# Patient Record
Sex: Female | Born: 1950 | Race: Black or African American | Hispanic: No | Marital: Married | Smoking: Never smoker
Health system: Southern US, Community
[De-identification: ages and names within clinical notes are randomized; demographics above are authoritative.]

## PROBLEM LIST (undated history)

## (undated) DIAGNOSIS — E039 Hypothyroidism, unspecified: Secondary | ICD-10-CM

## (undated) HISTORY — DX: Hypothyroidism, unspecified: E03.9

---

## 2001-03-25 ENCOUNTER — Encounter: Payer: Self-pay | Admitting: Cardiovascular Disease

## 2001-03-25 ENCOUNTER — Ambulatory Visit (HOSPITAL_COMMUNITY): Admission: RE | Admit: 2001-03-25 | Discharge: 2001-03-25 | Payer: Self-pay | Admitting: Cardiovascular Disease

## 2001-05-29 ENCOUNTER — Other Ambulatory Visit: Admission: RE | Admit: 2001-05-29 | Discharge: 2001-05-29 | Payer: Self-pay | Admitting: Internal Medicine

## 2001-11-10 ENCOUNTER — Other Ambulatory Visit: Admission: RE | Admit: 2001-11-10 | Discharge: 2001-11-10 | Payer: Self-pay | Admitting: Internal Medicine

## 2002-04-19 ENCOUNTER — Other Ambulatory Visit: Admission: RE | Admit: 2002-04-19 | Discharge: 2002-04-19 | Payer: Self-pay | Admitting: Obstetrics and Gynecology

## 2002-07-13 ENCOUNTER — Other Ambulatory Visit: Admission: RE | Admit: 2002-07-13 | Discharge: 2002-07-13 | Payer: Self-pay | Admitting: Obstetrics and Gynecology

## 2002-11-02 ENCOUNTER — Other Ambulatory Visit: Admission: RE | Admit: 2002-11-02 | Discharge: 2002-11-02 | Payer: Self-pay | Admitting: Obstetrics and Gynecology

## 2003-04-19 ENCOUNTER — Other Ambulatory Visit: Admission: RE | Admit: 2003-04-19 | Discharge: 2003-04-19 | Payer: Self-pay | Admitting: Obstetrics and Gynecology

## 2004-01-12 ENCOUNTER — Other Ambulatory Visit: Admission: RE | Admit: 2004-01-12 | Discharge: 2004-01-12 | Payer: Self-pay | Admitting: Obstetrics and Gynecology

## 2004-07-24 ENCOUNTER — Inpatient Hospital Stay (HOSPITAL_COMMUNITY): Admission: AD | Admit: 2004-07-24 | Discharge: 2004-07-25 | Payer: Self-pay | Admitting: Obstetrics and Gynecology

## 2005-04-08 ENCOUNTER — Other Ambulatory Visit: Admission: RE | Admit: 2005-04-08 | Discharge: 2005-04-08 | Payer: Self-pay | Admitting: Obstetrics and Gynecology

## 2006-01-22 IMAGING — US US TRANSVAGINAL NON-OB
1 series · 18 of 25 positions shown · non-contrast
Comparison: none

CLINICAL DATA: Anemia, menorrhagia, and dysfunctional uterine bleeding. 
TRANSVAGINAL AND TRANSABDOMINAL PELVIC ULTRASOUND ? 07/24/2004
No prior studies for comparison. 
Both transabdominal and transvaginal studies were performed.  
TRANSABDOMINAL PELVIC ULTRASOUND  
Uterine dimensions are approximately 11 x 5.4 x 6.1 cm.  The endometrium appears prominent.   Adnexal regions are not visualized transabdominally.   
TRANSVAGINAL PELVIC SONOGRAPHY 
The endometrium is abnormally thickened for the patient?s age, measuring 24 mm.  Although fairly homogeneous, underlying endometrial carcinoma or polyp is not excluded by standard sonography. Uterine dimensions are approximately 10.3 x 5.7 x 7.2 cm.  There are no visible fibroids or other masses within the myometrium.  
The right ovary measures 2.6 x 1.7 x 1.8 cm and has a normal appearance.  The left ovary measures 4 x 2.6 x 3.2 cm.  There is a simple cyst of the left ovary measuring 2.5 x 2.1 x 3.2 cm.  Very small amount of free fluid is present in the pelvis. 
IMPRESSION
Thickened endometrium measuring 24 mm.  Underlying endometrial abnormality such as carcinoma or polyp is not excluded.  2.5 cm simple cyst of left ovary.

[Series 1: us transvaginal non-ob · 18 of 57 slices shown]
[im 1/57]
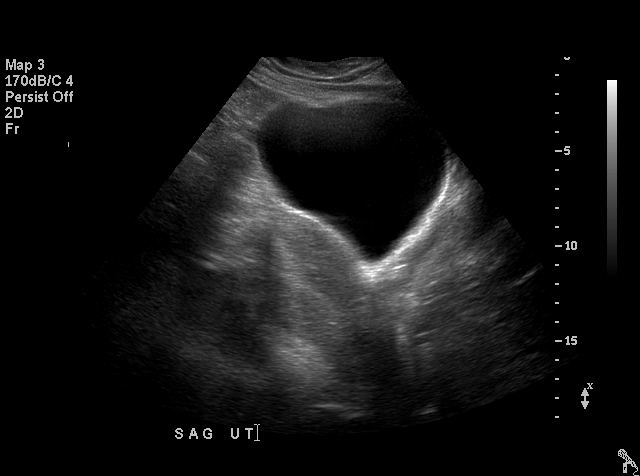
[im 5/57]
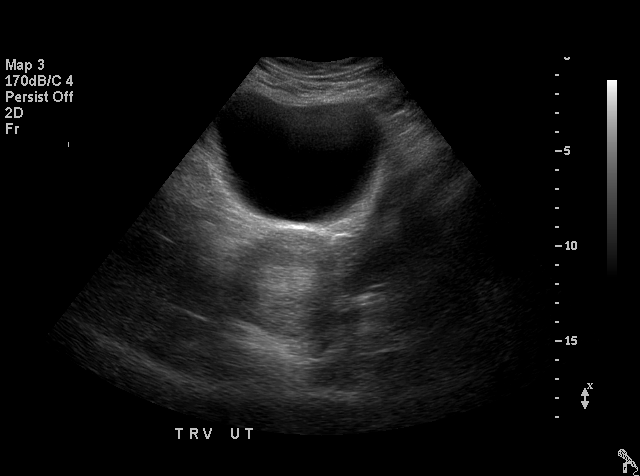
[im 8/57]
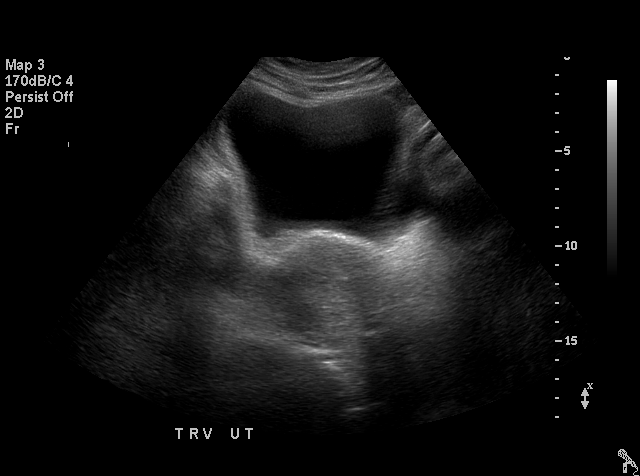
[im 10/57]
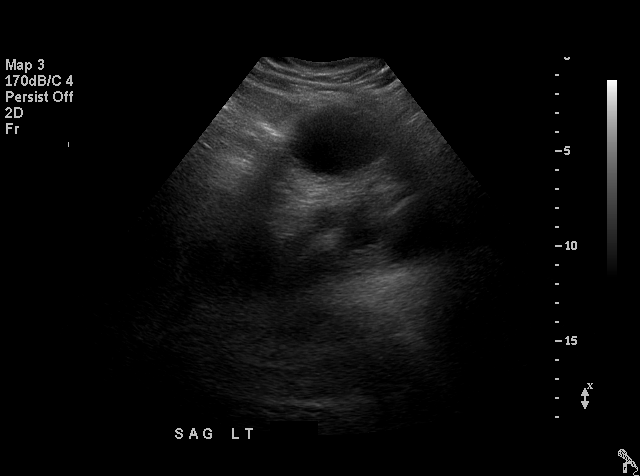
[im 15/57]
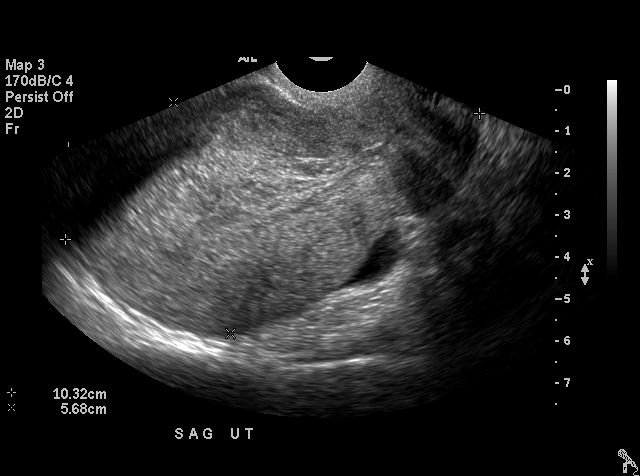
[im 17/57]
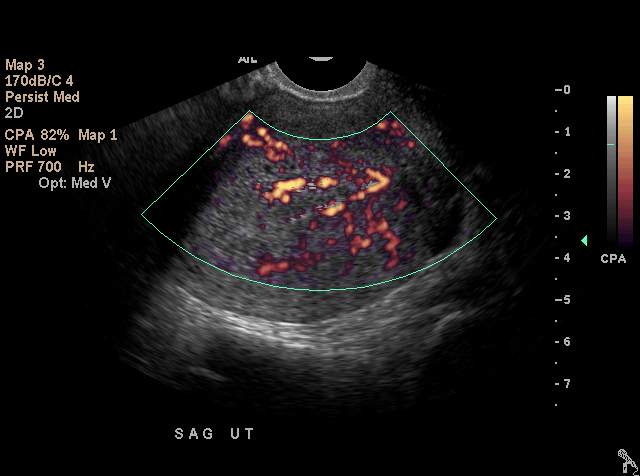
[im 22/57]
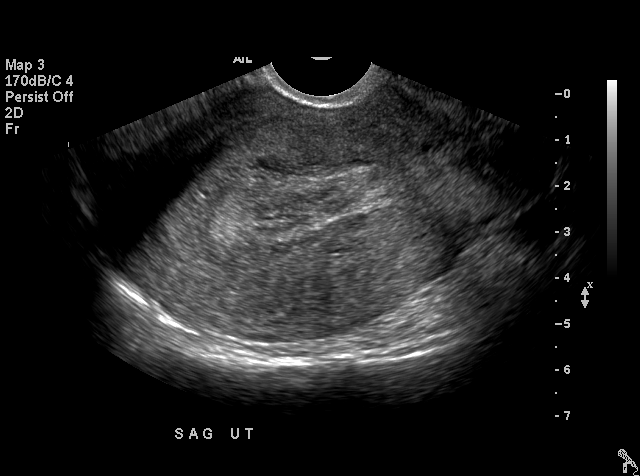
[im 24/57]
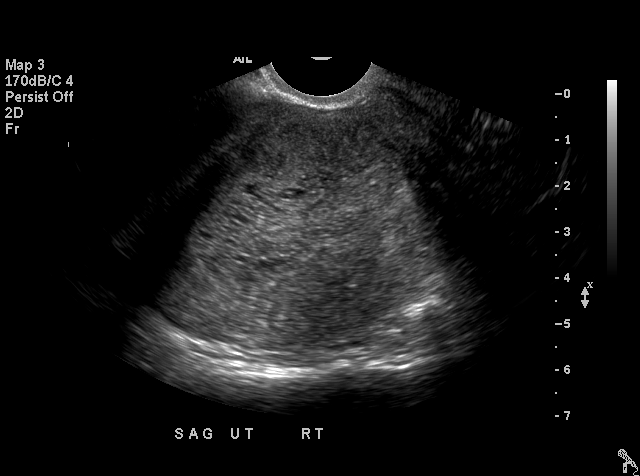
[im 26/57]
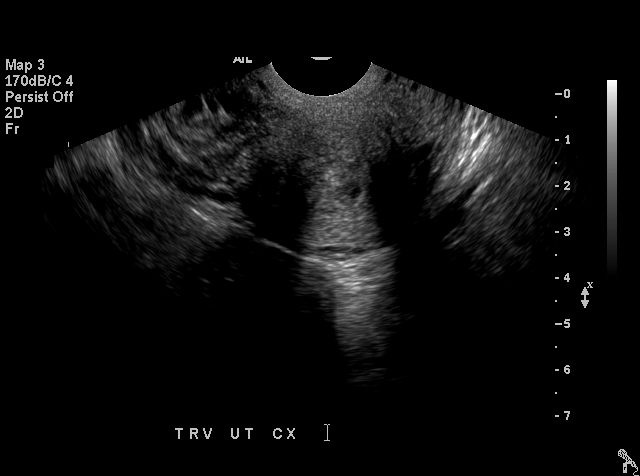
[im 31/57]
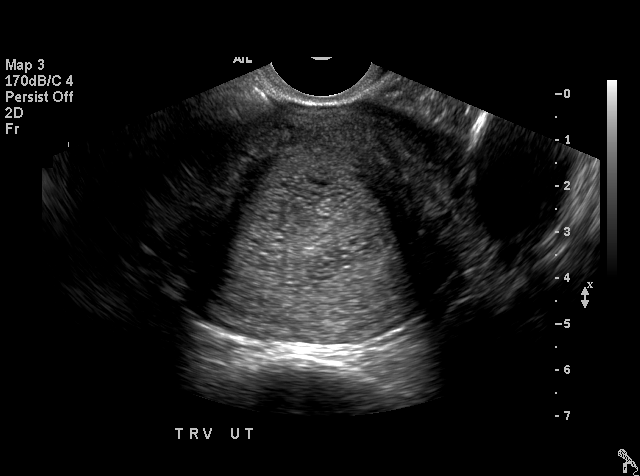
[im 33/57]
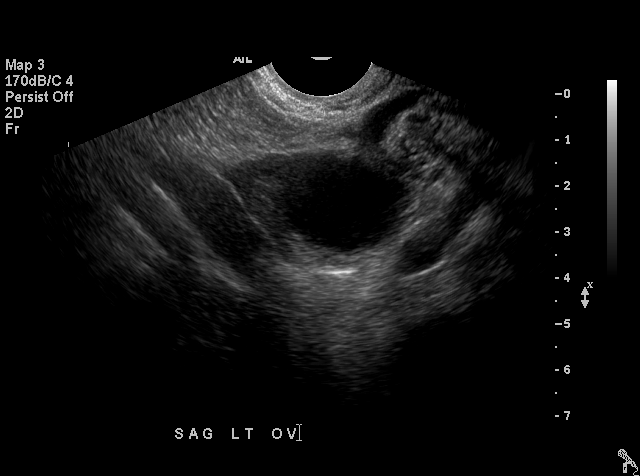
[im 36/57]
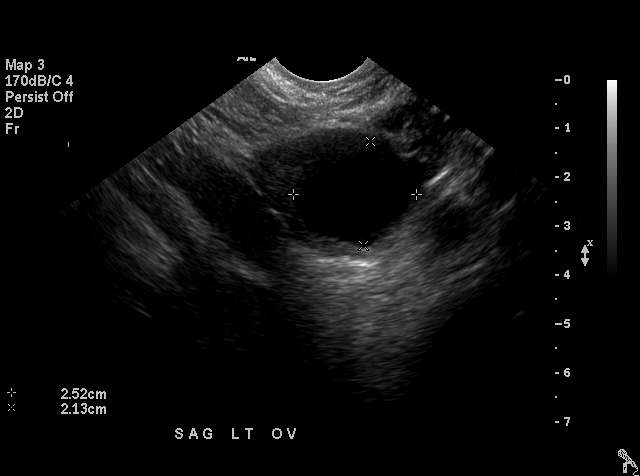
[im 40/57]
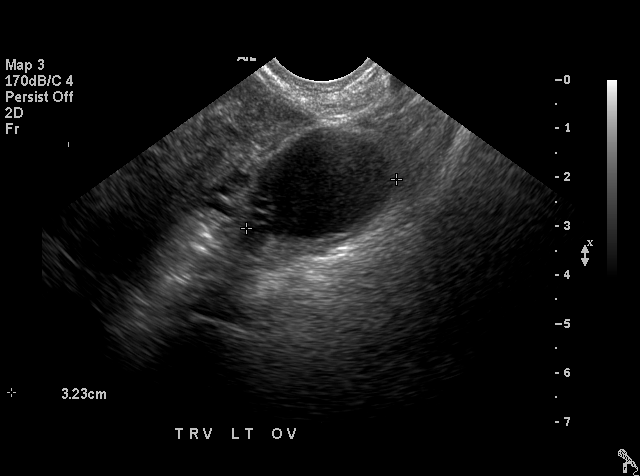
[im 43/57]
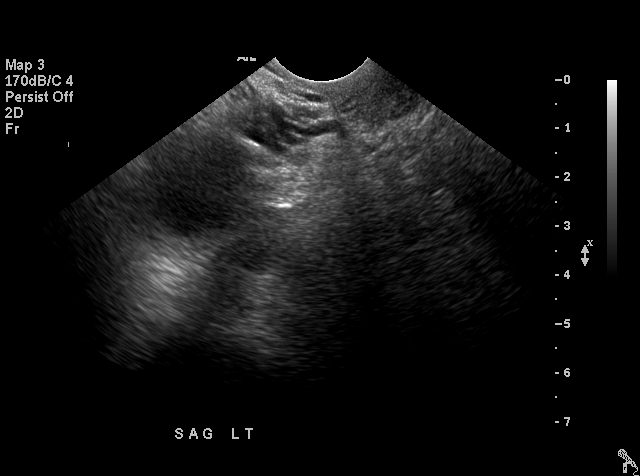
[im 47/57]
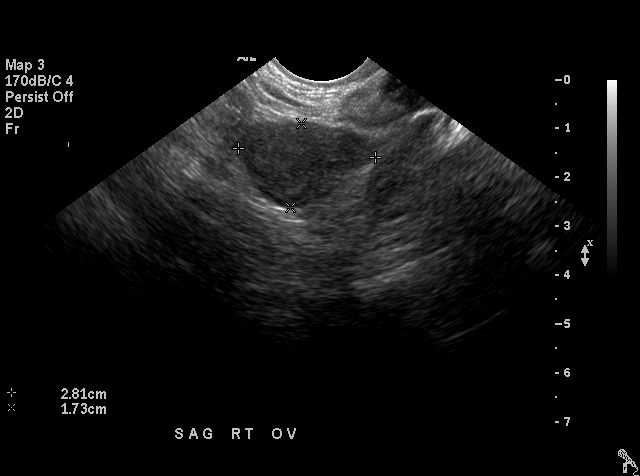
[im 50/57]
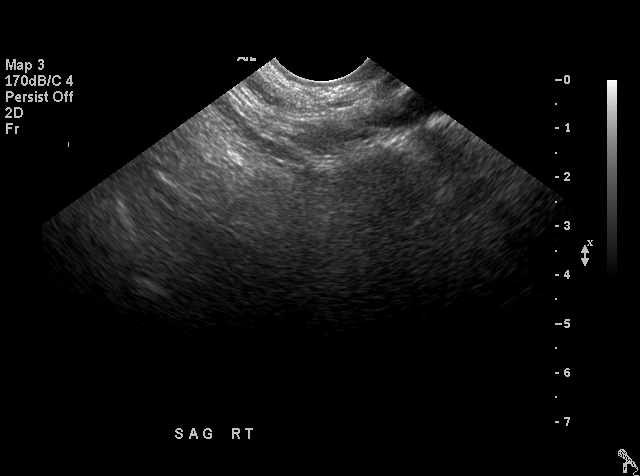
[im 52/57]
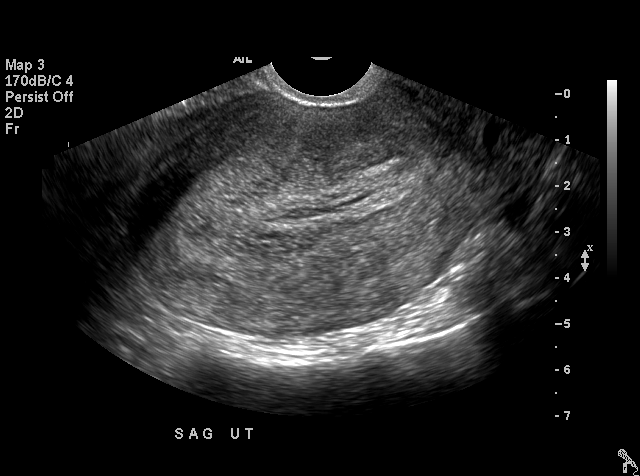
[im 57/57]
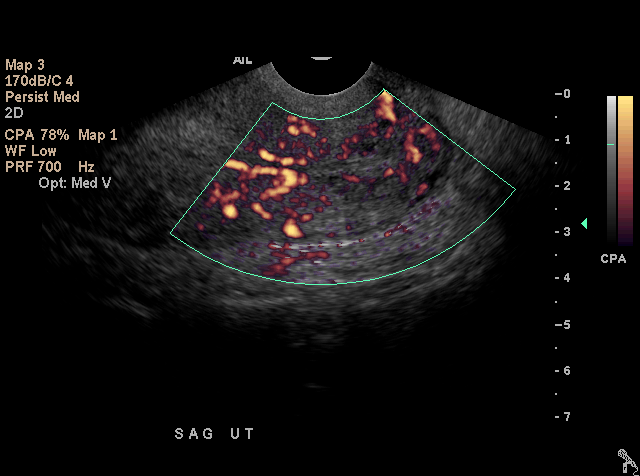

[18 of 25 positions shown; findings below may reference images not displayed]

## 2006-04-09 ENCOUNTER — Other Ambulatory Visit: Admission: RE | Admit: 2006-04-09 | Discharge: 2006-04-09 | Payer: Self-pay | Admitting: Obstetrics and Gynecology

## 2017-05-13 ENCOUNTER — Other Ambulatory Visit: Payer: Self-pay | Admitting: Nurse Practitioner

## 2017-05-13 DIAGNOSIS — R221 Localized swelling, mass and lump, neck: Secondary | ICD-10-CM

## 2017-05-21 ENCOUNTER — Ambulatory Visit
Admission: RE | Admit: 2017-05-21 | Discharge: 2017-05-21 | Disposition: A | Discharge status: Home / Self Care | Payer: BC Managed Care – PPO | Source: Ambulatory Visit | Attending: Nurse Practitioner | Admitting: Nurse Practitioner

## 2017-05-21 DIAGNOSIS — R221 Localized swelling, mass and lump, neck: Secondary | ICD-10-CM

## 2018-09-29 ENCOUNTER — Encounter: Payer: Self-pay | Admitting: Internal Medicine

## 2018-09-29 ENCOUNTER — Ambulatory Visit: Payer: BC Managed Care – PPO | Admitting: Internal Medicine

## 2018-09-29 VITALS — BP 130/80 | HR 65 | Temp 97.4°F | Ht 68.5 in | Wt 216.0 lb

## 2018-09-29 DIAGNOSIS — T2010XA Burn of first degree of head, face, and neck, unspecified site, initial encounter: Secondary | ICD-10-CM | POA: Diagnosis not present

## 2018-09-29 DIAGNOSIS — T3110 Burns involving 10-19% of body surface with 0% to 9% third degree burns: Secondary | ICD-10-CM

## 2018-09-29 DIAGNOSIS — T22212A Burn of second degree of left forearm, initial encounter: Secondary | ICD-10-CM | POA: Diagnosis not present

## 2018-09-29 DIAGNOSIS — Y93G3 Activity, cooking and baking: Secondary | ICD-10-CM

## 2018-09-29 DIAGNOSIS — X102XXA Contact with fats and cooking oils, initial encounter: Secondary | ICD-10-CM | POA: Diagnosis not present

## 2018-09-29 DIAGNOSIS — E038 Other specified hypothyroidism: Secondary | ICD-10-CM

## 2018-09-29 DIAGNOSIS — E039 Hypothyroidism, unspecified: Secondary | ICD-10-CM | POA: Insufficient documentation

## 2018-09-29 DIAGNOSIS — E782 Mixed hyperlipidemia: Secondary | ICD-10-CM | POA: Insufficient documentation

## 2018-09-29 DIAGNOSIS — E785 Hyperlipidemia, unspecified: Secondary | ICD-10-CM

## 2018-09-29 DIAGNOSIS — T300 Burn of unspecified body region, unspecified degree: Secondary | ICD-10-CM

## 2018-09-29 DIAGNOSIS — Z23 Encounter for immunization: Secondary | ICD-10-CM

## 2018-09-29 MED ORDER — SILVER SULFADIAZINE 1 % EX CREA: 1.0000 "application " | TOPICAL_CREAM | Freq: Two times a day (BID) | CUTANEOUS | 0 refills | Status: AC
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
## 2018-09-29 NOTE — Patient Instructions (Signed)
Burn Care, Adult A burn is an injury to the skin or the tissues under the skin. There are three types of burns:  First degree. These burns may cause the skin to be red and a bit swollen.  Second degree. These burns are very painful and cause the skin to be very red. The skin may also leak fluid, look shiny, and start to have blisters.  Third degree. These burns cause permanent damage. They turn the skin white or black and make it look charred, dry, and leathery.  Taking care of your burn properly can help to prevent pain and infection. It can also help the burn to heal more quickly. How is this treated? Right after a burn:  Rinse or soak the burn under cool water. Do this for several minutes. Do not put ice on your burn. That can cause more damage.  Lightly cover the burn with a clean (sterile) cloth (dressing). Burn care  Raise (elevate) the injured area above the level of your heart while sitting or lying down.  Follow instructions from your doctor about: ? How to clean and take care of the burn. ? When to change and remove the cloth.  Check your burn every day for signs of infection. Check for: ? More redness, swelling, or pain. ? Warmth. ? Pus or a bad smell. Medicine   Take over-the-counter and prescription medicines only as told by your doctor.  If you were prescribed antibiotic medicine, take or apply it as told by your doctor. Do not stop using the antibiotic even if your condition improves. General instructions  To prevent infection: ? Do not put butter, oil, or other home treatments on the burn. ? Do not scratch or pick at the burn. ? Do not break any blisters. ? Do not peel skin.  Do not rub your burn, even when you are cleaning it.  Protect your burn from the sun. Contact a doctor if:  Your condition does not get better.  Your condition gets worse.  You have a fever.  Your burn looks different or starts to have black or red spots on it.  Your burn  feels warm to the touch.  Your pain is not controlled with medicine. Get help right away if:  You have redness, swelling, or pain at the site of the burn.  You have fluid, blood, or pus coming from your burn.  You have red streaks near the burn.  You have very bad pain. This information is not intended to replace advice given to you by your health care provider. Make sure you discuss any questions you have with your health care provider. Document Released: 07/23/2008 Document Revised: 11/30/2016 Document Reviewed: 04/02/2016 Elsevier Interactive Patient Education  2018 [employer] Inc[employer]  

## 2018-10-06 ENCOUNTER — Ambulatory Visit: Payer: BC Managed Care – PPO | Admitting: Internal Medicine

## 2018-10-06 ENCOUNTER — Encounter: Payer: Self-pay | Admitting: Internal Medicine

## 2018-10-06 VITALS — BP 116/76 | HR 57 | Temp 97.9°F | Ht 68.5 in | Wt 217.0 lb

## 2018-10-06 DIAGNOSIS — T2010XS Burn of first degree of head, face, and neck, unspecified site, sequela: Secondary | ICD-10-CM

## 2018-10-06 DIAGNOSIS — T22212S Burn of second degree of left forearm, sequela: Secondary | ICD-10-CM

## 2018-10-06 DIAGNOSIS — Z09 Encounter for follow-up examination after completed treatment for conditions other than malignant neoplasm: Secondary | ICD-10-CM | POA: Diagnosis not present

## 2018-10-06 DIAGNOSIS — X102XXS Contact with fats and cooking oils, sequela: Secondary | ICD-10-CM

## 2018-10-06 DIAGNOSIS — T300 Burn of unspecified body region, unspecified degree: Secondary | ICD-10-CM
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
## 2018-10-19 ENCOUNTER — Ambulatory Visit: Payer: BC Managed Care – PPO | Admitting: Nurse Practitioner

## 2018-10-19 ENCOUNTER — Encounter: Payer: Self-pay | Admitting: Nurse Practitioner

## 2018-10-19 VITALS — BP 130/86 | HR 58 | Temp 97.7°F | Ht 68.0 in | Wt 216.6 lb

## 2018-10-19 DIAGNOSIS — T2642XD Burn of left eye and adnexa, part unspecified, subsequent encounter: Secondary | ICD-10-CM

## 2018-10-19 DIAGNOSIS — T22212S Burn of second degree of left forearm, sequela: Secondary | ICD-10-CM | POA: Diagnosis not present

## 2018-10-19 DIAGNOSIS — T3110 Burns involving 10-19% of body surface with 0% to 9% third degree burns: Secondary | ICD-10-CM | POA: Insufficient documentation

## 2018-10-19 DIAGNOSIS — T2000XS Burn of unspecified degree of head, face, and neck, unspecified site, sequela: Secondary | ICD-10-CM

## 2018-10-19 DIAGNOSIS — T2000XD Burn of unspecified degree of head, face, and neck, unspecified site, subsequent encounter: Secondary | ICD-10-CM | POA: Insufficient documentation

## 2018-10-19 DIAGNOSIS — T2000XA Burn of unspecified degree of head, face, and neck, unspecified site, initial encounter: Secondary | ICD-10-CM

## 2018-10-19 NOTE — Patient Instructions (Signed)
Burn Care, Adult  A burn is an injury to the skin or the tissues under the skin. There are three types of burns:  · First degree. These burns may cause the skin to be red and a bit swollen.  · Second degree. These burns are very painful and cause the skin to be very red. The skin may also leak fluid, look shiny, and start to have blisters.  · Third degree. These burns cause permanent damage. They turn the skin white or black and make it look charred, dry, and leathery.  Taking care of your burn properly can help to prevent pain and infection. It can also help the burn to heal more quickly.  How is this treated?  Right after a burn:  · Rinse or soak the burn under cool water. Do this for several minutes. Do not put ice on your burn. That can cause more damage.  · Lightly cover the burn with a clean (sterile) cloth (dressing).  Burn care  · Raise (elevate) the injured area above the level of your heart while sitting or lying down.  · Follow instructions from your doctor about:  ? How to clean and take care of the burn.  ? When to change and remove the cloth.  · Check your burn every day for signs of infection. Check for:  ? More redness, swelling, or pain.  ? Warmth.  ? Pus or a bad smell.  Medicine    · Take over-the-counter and prescription medicines only as told by your doctor.  · If you were prescribed antibiotic medicine, take or apply it as told by your doctor. Do not stop using the antibiotic even if your condition improves.  General instructions  · To prevent infection:  ? Do not put butter, oil, or other home treatments on the burn.  ? Do not scratch or pick at the burn.  ? Do not break any blisters.  ? Do not peel skin.  · Do not rub your burn, even when you are cleaning it.  · Protect your burn from the sun.  Contact a doctor if:  · Your condition does not get better.  · Your condition gets worse.  · You have a fever.  · Your burn looks different or starts to have black or red spots on it.  · Your burn  feels warm to the touch.  · Your pain is not controlled with medicine.  Get help right away if:  · You have redness, swelling, or pain at the site of the burn.  · You have fluid, blood, or pus coming from your burn.  · You have red streaks near the burn.  · You have very bad pain.  This information is not intended to replace advice given to you by your health care provider. Make sure you discuss any questions you have with your health care provider.  Document Released: 07/23/2008 Document Revised: 05/27/2017 Document Reviewed: 04/02/2016  Elsevier Interactive Patient Education © 2019 Elsevier Inc.
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
## 2018-10-27 ENCOUNTER — Encounter: Payer: Self-pay | Admitting: Nurse Practitioner

## 2018-10-29 ENCOUNTER — Ambulatory Visit: Payer: BC Managed Care – PPO | Admitting: Nurse Practitioner

## 2018-10-29 ENCOUNTER — Encounter: Payer: Self-pay | Admitting: Nurse Practitioner

## 2018-10-29 VITALS — BP 130/82 | HR 63 | Temp 97.6°F | Ht 68.0 in | Wt 214.0 lb

## 2018-10-29 DIAGNOSIS — T3110 Burns involving 10-19% of body surface with 0% to 9% third degree burns: Secondary | ICD-10-CM | POA: Diagnosis not present

## 2018-10-29 DIAGNOSIS — T2642XD Burn of left eye and adnexa, part unspecified, subsequent encounter: Secondary | ICD-10-CM

## 2018-10-29 DIAGNOSIS — T2000XD Burn of unspecified degree of head, face, and neck, unspecified site, subsequent encounter: Secondary | ICD-10-CM

## 2018-10-29 DIAGNOSIS — T22212S Burn of second degree of left forearm, sequela: Secondary | ICD-10-CM
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
## 2018-11-25 ENCOUNTER — Other Ambulatory Visit: Payer: Self-pay | Admitting: Nurse Practitioner

## 2018-11-30 ENCOUNTER — Ambulatory Visit: Payer: BC Managed Care – PPO | Admitting: Nurse Practitioner

## 2018-11-30 ENCOUNTER — Encounter: Payer: Self-pay | Admitting: Nurse Practitioner

## 2018-11-30 VITALS — BP 122/74 | HR 60 | Temp 98.1°F | Ht 65.6 in | Wt 214.4 lb

## 2018-11-30 DIAGNOSIS — E039 Hypothyroidism, unspecified: Secondary | ICD-10-CM

## 2018-11-30 DIAGNOSIS — T22212S Burn of second degree of left forearm, sequela: Secondary | ICD-10-CM

## 2018-11-30 MED ORDER — LEVOTHYROXINE SODIUM 88 MCG PO TABS: 88.0000 ug | ORAL_TABLET | Freq: Every day | ORAL | 1 refills | Status: DC
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
## 2018-11-30 NOTE — Patient Instructions (Signed)
Hypothyroidism  Hypothyroidism is when the thyroid gland does not make enough of certain hormones (it is underactive). The thyroid gland is a small gland located in the lower front part of the neck, just in front of the windpipe (trachea). This gland makes hormones that help control how the body uses food for energy (metabolism) as well as how the heart and brain function. These hormones also play a role in keeping your bones strong. When the thyroid is underactive, it produces too little of the hormones thyroxine (T4) and triiodothyronine (T3). What are the causes? This condition may be caused by:  Hashimoto's disease. This is a disease in which the body's disease-fighting system (immune system) attacks the thyroid gland. This is the most common cause.  Viral infections.  Pregnancy.  Certain medicines.  Birth defects.  Past radiation treatments to the head or neck for cancer.  Past treatment with radioactive iodine.  Past exposure to radiation in the environment.  Past surgical removal of part or all of the thyroid.  Problems with a gland in the center of the brain (pituitary gland).  Lack of enough iodine in the diet. What increases the risk? You are more likely to develop this condition if:  You are female.  You have a family history of thyroid conditions.  You use a medicine called lithium.  You take medicines that affect the immune system (immunosuppressants). What are the signs or symptoms? Symptoms of this condition include:  Feeling as though you have no energy (lethargy).  Not being able to tolerate cold.  Weight gain that is not explained by a change in diet or exercise habits.  Lack of appetite.  Dry skin.  Coarse hair.  Menstrual irregularity.  Slowing of thought processes.  Constipation.  Sadness or depression. How is this diagnosed? This condition may be diagnosed based on:  Your symptoms, your medical history, and a physical exam.  Blood  tests. You may also have imaging tests, such as an ultrasound or MRI. How is this treated? This condition is treated with medicine that replaces the thyroid hormones that your body does not make. After you begin treatment, it may take several weeks for symptoms to go away. Follow these instructions at home:  Take over-the-counter and prescription medicines only as told by your health care provider.  If you start taking any new medicines, tell your health care provider.  Keep all follow-up visits as told by your health care provider. This is important. ? As your condition improves, your dosage of thyroid hormone medicine may change. ? You will need to have blood tests regularly so that your health care provider can monitor your condition. Contact a health care provider if:  Your symptoms do not get better with treatment.  You are taking thyroid replacement medicine and you: ? Sweat a lot. ? Have tremors. ? Feel anxious. ? Lose weight rapidly. ? Cannot tolerate heat. ? Have emotional swings. ? Have diarrhea. ? Feel weak. Get help right away if you have:  Chest pain.  An irregular heartbeat.  A rapid heartbeat.  Difficulty breathing. Summary  Hypothyroidism is when the thyroid gland does not make enough of certain hormones (it is underactive).  When the thyroid is underactive, it produces too little of the hormones thyroxine (T4) and triiodothyronine (T3).  The most common cause is Hashimoto's disease, a disease in which the body's disease-fighting system (immune system) attacks the thyroid gland. The condition can also be caused by viral infections, medicine, pregnancy, or past   radiation treatment to the head or neck.  Symptoms may include weight gain, dry skin, constipation, feeling as though you do not have energy, and not being able to tolerate cold.  This condition is treated with medicine to replace the thyroid hormones that your body does not make. This information  is not intended to replace advice given to you by your health care provider. Make sure you discuss any questions you have with your health care provider. Document Released: 10/14/2005 Document Revised: 09/24/2017 Document Reviewed: 09/24/2017 Elsevier Interactive Patient Education  2019 [employer]  

## 2018-12-01 LAB — T4: T4 TOTAL: 9.1 ug/dL (ref 4.5–12.0)

## 2018-12-01 LAB — T3, FREE: T3, Free: 2.4 pg/mL (ref 2.0–4.4)

## 2018-12-01 LAB — TSH: TSH: 2.03 u[IU]/mL (ref 0.450–4.500)

## 2019-01-04 ENCOUNTER — Ambulatory Visit: Payer: BC Managed Care – PPO | Admitting: Nurse Practitioner

## 2019-01-04 ENCOUNTER — Ambulatory Visit: Payer: Self-pay | Admitting: Nurse Practitioner

## 2019-01-04 VITALS — BP 112/80 | HR 52 | Temp 97.9°F | Wt 212.2 lb

## 2019-01-04 DIAGNOSIS — X102XXS Contact with fats and cooking oils, sequela: Secondary | ICD-10-CM | POA: Diagnosis not present

## 2019-01-04 DIAGNOSIS — T22212S Burn of second degree of left forearm, sequela: Secondary | ICD-10-CM

## 2019-01-04 NOTE — Progress Notes (Signed)
  Subjective:     Patient ID: Julie Zamora , female    DOB: June 17, 1951 , 68 y.o.   MRN: [id number]   Chief Complaint  Patient presents with  . Burn    HPI  Left arm blistering she has been using neosporin.    She has been wearing jewelry, applying her hands, wearing sweaters.      Past Medical History:  Diagnosis Date  . Hypothyroidism      Family History  Problem Relation Age of Onset  . Cancer Mother   . Diabetes Father   . Cancer Cousin   . Cancer Cousin      Current Outpatient Medications:  .  levothyroxine (SYNTHROID, LEVOTHROID) 88 MCG tablet, Take 1 tablet (88 mcg total) by mouth daily., Disp: 90 tablet, Rfl: 1 .  nystatin (NYSTATIN) powder, Nystop 100,000 unit/gram topical powder, Disp: , Rfl:    No Known Allergies   Review of Systems  Respiratory: Negative.   Cardiovascular: Negative.   Skin: Positive for wound (left forearm).  Neurological: Negative for dizziness and headaches.     Today's Vitals   01/04/19 1620  BP: 112/80  Pulse: (!) 52  Temp: 97.9 F (36.6 C)  TempSrc: Oral  SpO2: 96%  Weight: 212 lb 3.2 oz (96.3 kg)   Body mass index is 34.67 kg/m.   Objective:  Physical Exam Constitutional:      Appearance: Normal appearance.  Cardiovascular:     Rate and Rhythm: Normal rate.     Pulses: Normal pulses.     Heart sounds: Normal heart sounds. No murmur.  Pulmonary:     Effort: Pulmonary effort is normal. No respiratory distress.     Breath sounds: Normal breath sounds.  Skin:    General: Skin is warm and dry.     Capillary Refill: Capillary refill takes less than 2 seconds.     Comments: Left wrist/forearm with dried area and slightly irritated area.  Neurological:     General: No focal deficit present.     Mental Status: She is alert.  Psychiatric:        Mood and Affect: Mood normal.        Behavior: Behavior normal.        Thought Content: Thought content normal.        Judgment: Judgment normal.         Assessment  And Plan:     1. Burn of forearm, left, second degree, sequela  Dried healing burn, advised to avoid wearing jewelry or watches on this arm.   May continue to use neosporin to area.    Arnette Felts, FNP

## 2019-01-04 NOTE — Patient Instructions (Signed)
Keep area clean and dry Clean with antibacterial soap No jewelry to that arm This process will be about 6 months

## 2019-01-06 ENCOUNTER — Other Ambulatory Visit: Payer: Self-pay | Admitting: Nurse Practitioner

## 2019-01-06 DIAGNOSIS — E039 Hypothyroidism, unspecified: Secondary | ICD-10-CM

## 2019-01-24 ENCOUNTER — Encounter: Payer: Self-pay | Admitting: Nurse Practitioner

## 2019-03-31 ENCOUNTER — Other Ambulatory Visit: Payer: Self-pay | Admitting: Nurse Practitioner

## 2019-03-31 DIAGNOSIS — E039 Hypothyroidism, unspecified: Secondary | ICD-10-CM

## 2019-05-18 IMAGING — US US THYROID
1 series · 13 of 13 positions shown · non-contrast
Comparison: None.

CLINICAL DATA: Posterior right neck nontender palpable region x1
month.

EXAM:
ULTRASOUND OF HEAD/NECK SOFT TISSUES
TECHNIQUE: Ultrasound examination of the head and neck soft tissues was
performed in the area of clinical concern.

[Series 1: us thyroid · 0.03mm/px · 13 of 13 slices shown]
[im 1/13]
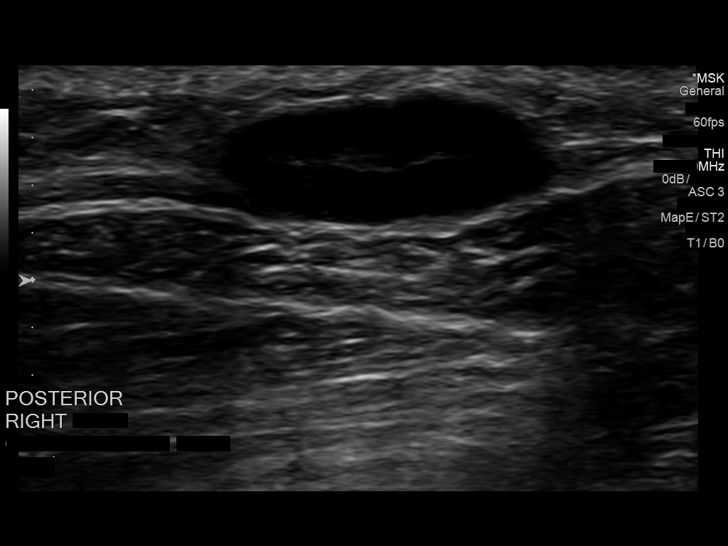
[im 2/13]
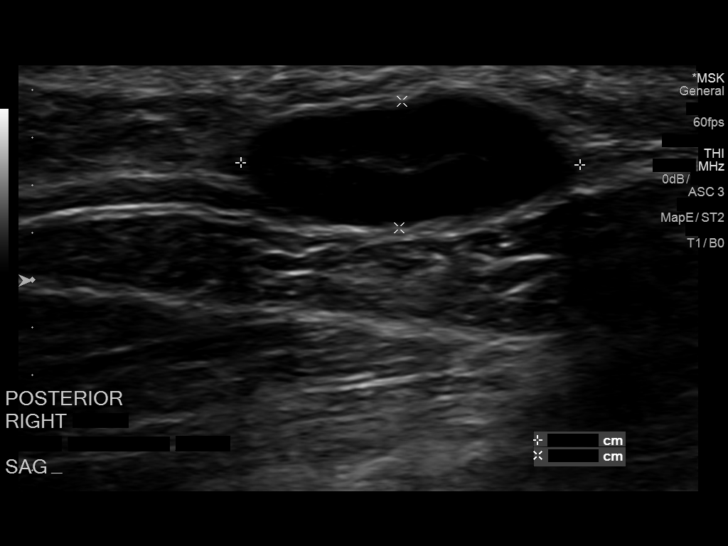
[im 3/13]
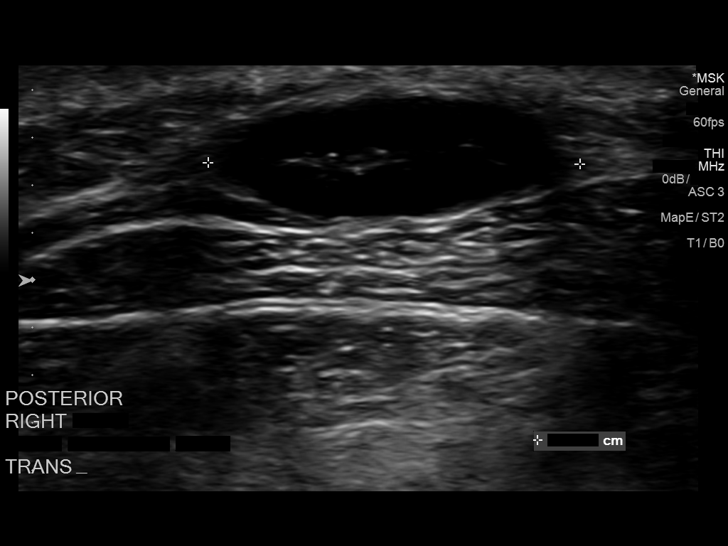
[im 4/13]
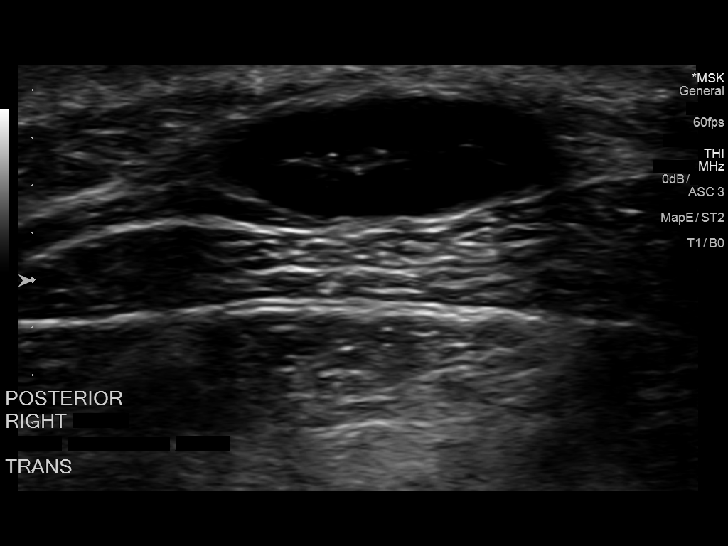
[im 5/13]
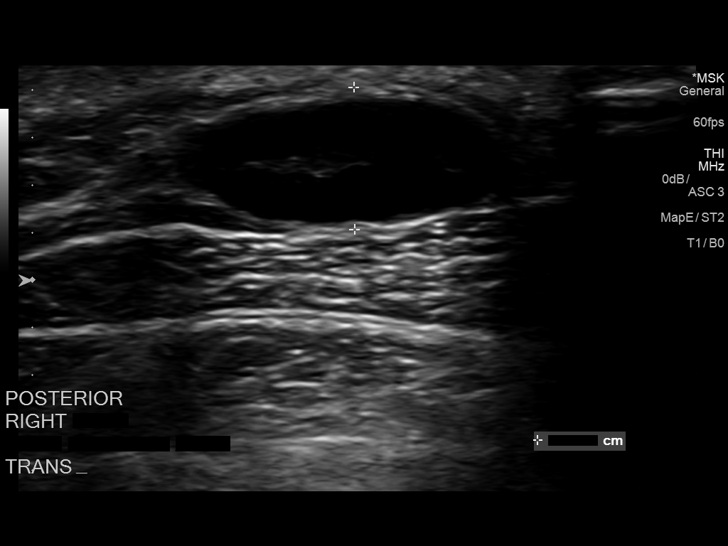
[im 6/13]
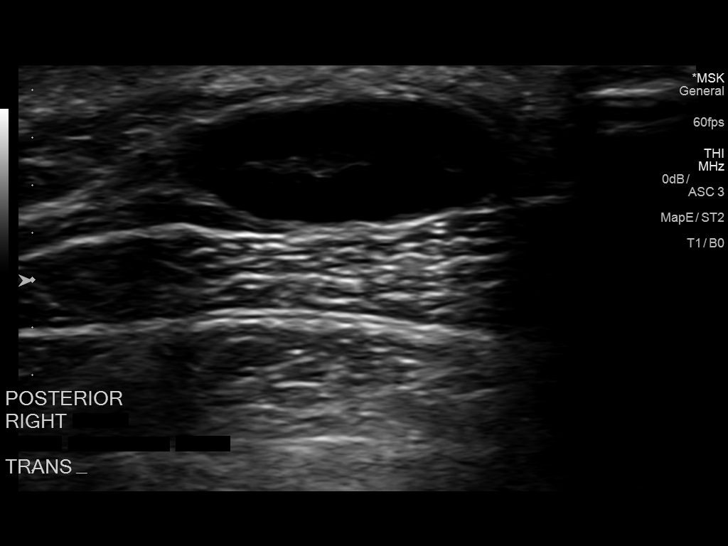
[im 7/13]
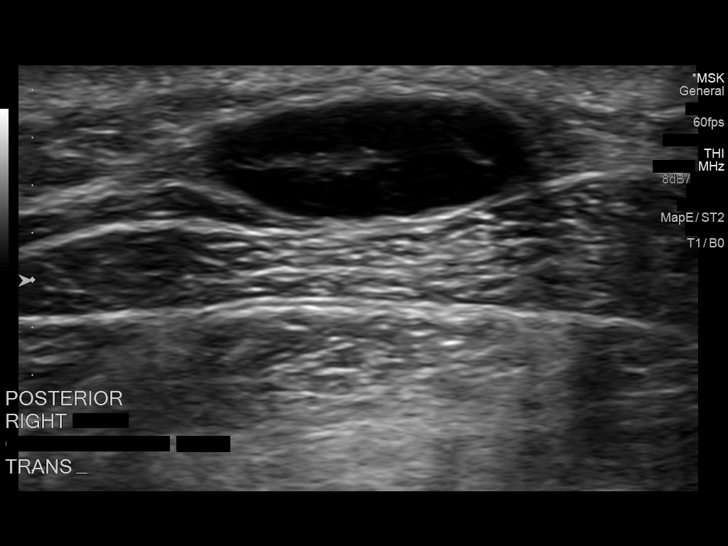
[im 8/13]
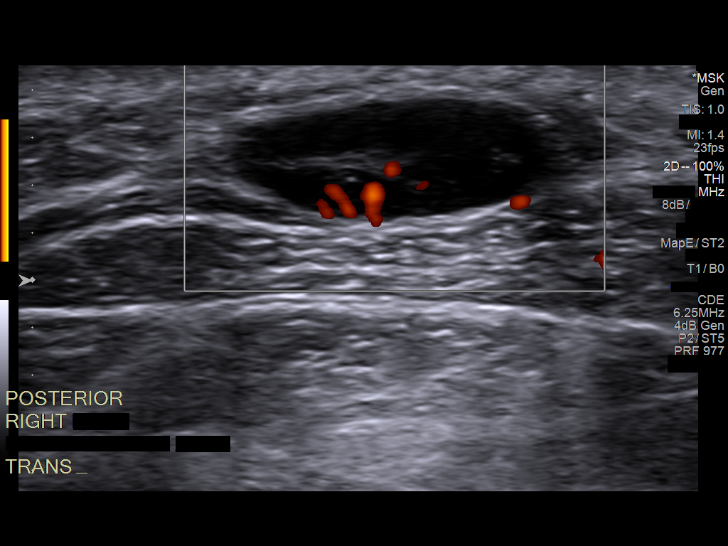
[im 9/13]
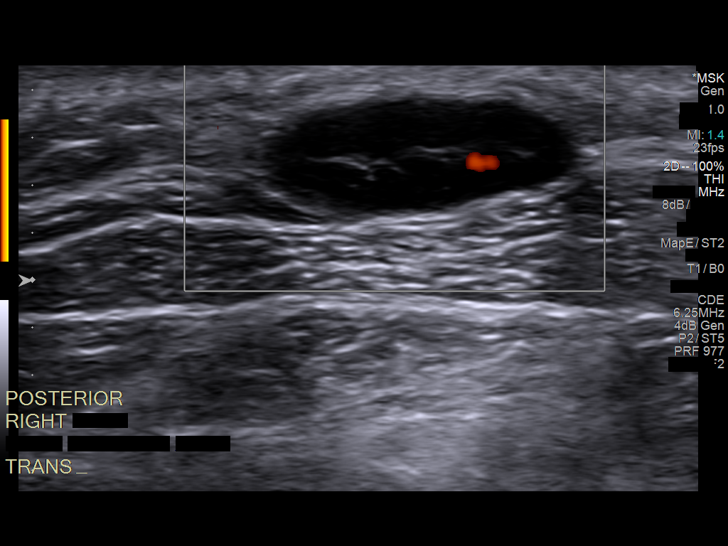
[im 10/13]
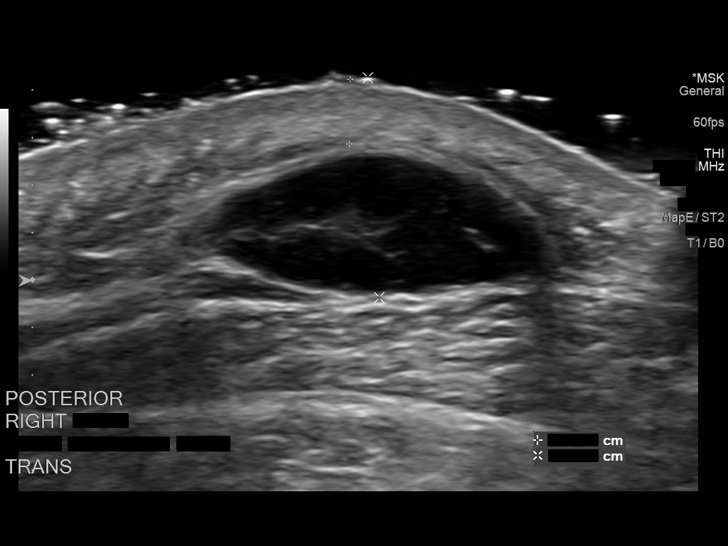
[im 11/13]
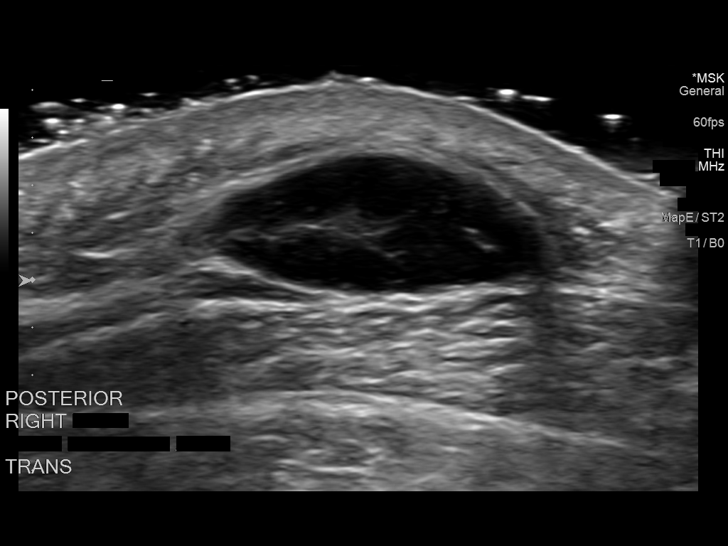
[im 12/13]
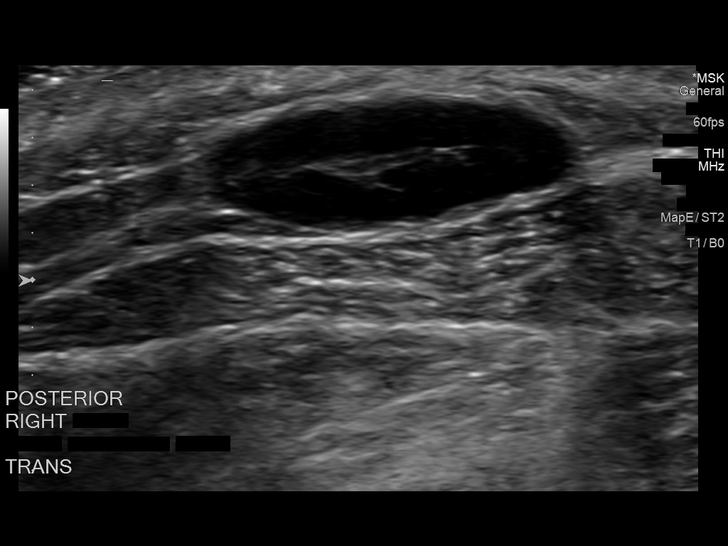
[im 13/13]
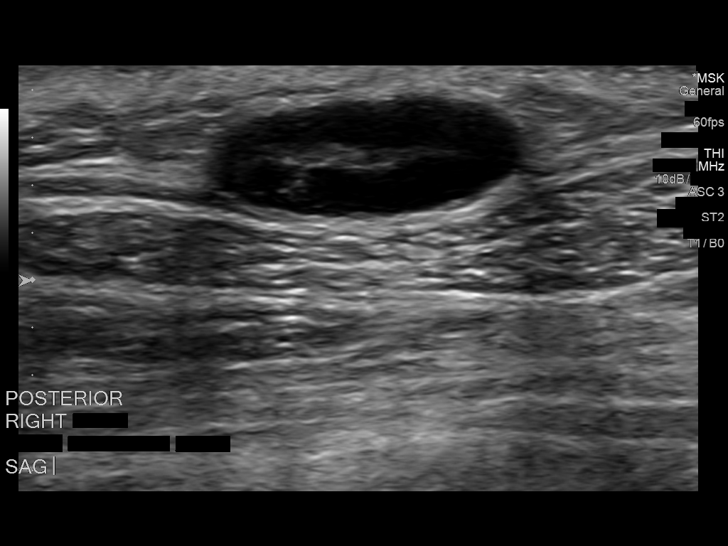

[13 of 13 positions shown; findings below may reference images not displayed]

FINDINGS: There is a hypoechoic lymph node 1.4 x 0.5 x 1.6 cm corresponding to
palpable region. Normal fatty hilum with low level color flow signal
. No other pathologic findings.
IMPRESSION: 1. Prominent subcutaneous lymph node corresponding to palpable
region.

## 2019-06-02 ENCOUNTER — Encounter: Payer: Self-pay | Admitting: Nurse Practitioner

## 2019-06-02 ENCOUNTER — Other Ambulatory Visit: Payer: Self-pay

## 2019-06-02 ENCOUNTER — Ambulatory Visit: Payer: BC Managed Care – PPO | Admitting: Nurse Practitioner

## 2019-06-02 VITALS — BP 122/80 | HR 51 | Temp 98.1°F | Ht 68.4 in | Wt 217.8 lb

## 2019-06-02 DIAGNOSIS — Z0000 Encounter for general adult medical examination without abnormal findings: Secondary | ICD-10-CM

## 2019-06-02 DIAGNOSIS — E039 Hypothyroidism, unspecified: Secondary | ICD-10-CM | POA: Diagnosis not present

## 2019-06-02 DIAGNOSIS — Z23 Encounter for immunization: Secondary | ICD-10-CM

## 2019-06-02 DIAGNOSIS — E782 Mixed hyperlipidemia: Secondary | ICD-10-CM | POA: Diagnosis not present

## 2019-06-02 DIAGNOSIS — Z139 Encounter for screening, unspecified: Secondary | ICD-10-CM

## 2019-06-02 LAB — POCT URINALYSIS DIPSTICK
Bilirubin, UA: NEGATIVE
Glucose, UA: NEGATIVE
Ketones, UA: NEGATIVE
Nitrite, UA: NEGATIVE
Protein, UA: NEGATIVE
Spec Grav, UA: 1.02 (ref 1.010–1.025)
Urobilinogen, UA: 0.2 E.U./dL
pH, UA: 5.5 (ref 5.0–8.0)

## 2019-06-02 NOTE — Patient Instructions (Signed)
Health Maintenance, Female Adopting a healthy lifestyle and getting preventive care are important in promoting health and wellness. Ask your health care provider about:  The right schedule for you to have regular tests and exams.  Things you can do on your own to prevent diseases and keep yourself healthy. What should I know about diet, weight, and exercise? Eat a healthy diet   Eat a diet that includes plenty of vegetables, fruits, low-fat dairy products, and lean protein.  Do not eat a lot of foods that are high in solid fats, added sugars, or sodium. Maintain a healthy weight Body mass index (BMI) is used to identify weight problems. It estimates body fat based on height and weight. Your health care provider can help determine your BMI and help you achieve or maintain a healthy weight. Get regular exercise Get regular exercise. This is one of the most important things you can do for your health. Most adults should:  Exercise for at least 150 minutes each week. The exercise should increase your heart rate and make you sweat (moderate-intensity exercise).  Do strengthening exercises at least twice a week. This is in addition to the moderate-intensity exercise.  Spend less time sitting. Even light physical activity can be beneficial. Watch cholesterol and blood lipids Have your blood tested for lipids and cholesterol at 66 years of age, then have this test every 5 years. Have your cholesterol levels checked more often if:  Your lipid or cholesterol levels are high.  You are older than 66 years of age.  You are at high risk for heart disease. What should I know about cancer screening? Depending on your health history and family history, you may need to have cancer screening at various ages. This may include screening for:  Breast cancer.  Cervical cancer.  Colorectal cancer.  Skin cancer.  Lung cancer. What should I know about heart disease, diabetes, and high blood  pressure? Blood pressure and heart disease  High blood pressure causes heart disease and increases the risk of stroke. This is more likely to develop in people who have high blood pressure readings, are of African descent, or are overweight.  Have your blood pressure checked: ? Every 3-5 years if you are 18-39 years of age. ? Every year if you are 40 years old or older. Diabetes Have regular diabetes screenings. This checks your fasting blood sugar level. Have the screening done:  Once every three years after age 40 if you are at a normal weight and have a low risk for diabetes.  More often and at a younger age if you are overweight or have a high risk for diabetes. What should I know about preventing infection? Hepatitis B If you have a higher risk for hepatitis B, you should be screened for this virus. Talk with your health care provider to find out if you are at risk for hepatitis B infection. Hepatitis C Testing is recommended for:  Everyone born from 1945 through 1965.  Anyone with known risk factors for hepatitis C. Sexually transmitted infections (STIs)  Get screened for STIs, including gonorrhea and chlamydia, if: ? You are sexually active and are younger than 66 years of age. ? You are older than 66 years of age and your health care provider tells you that you are at risk for this type of infection. ? Your sexual activity has changed since you were last screened, and you are at increased risk for chlamydia or gonorrhea. Ask your health care provider if   you are at risk.  Ask your health care provider about whether you are at high risk for HIV. Your health care provider may recommend a prescription medicine to help prevent HIV infection. If you choose to take medicine to prevent HIV, you should first get tested for HIV. You should then be tested every 3 months for as long as you are taking the medicine. Pregnancy  If you are about to stop having your period (premenopausal) and  you may become pregnant, seek counseling before you get pregnant.  Take 400 to 800 micrograms (mcg) of folic acid every day if you become pregnant.  Ask for birth control (contraception) if you want to prevent pregnancy. Osteoporosis and menopause Osteoporosis is a disease in which the bones lose minerals and strength with aging. This can result in bone fractures. If you are 65 years old or older, or if you are at risk for osteoporosis and fractures, ask your health care provider if you should:  Be screened for bone loss.  Take a calcium or vitamin D supplement to lower your risk of fractures.  Be given hormone replacement therapy (HRT) to treat symptoms of menopause. Follow these instructions at home: Lifestyle  Do not use any products that contain nicotine or tobacco, such as cigarettes, e-cigarettes, and chewing tobacco. If you need help quitting, ask your health care provider.  Do not use street drugs.  Do not share needles.  Ask your health care provider for help if you need support or information about quitting drugs. Alcohol use  Do not drink alcohol if: ? Your health care provider tells you not to drink. ? You are pregnant, may be pregnant, or are planning to become pregnant.  If you drink alcohol: ? Limit how much you use to 0-1 drink a day. ? Limit intake if you are breastfeeding.  Be aware of how much alcohol is in your drink. In the [place] one drink equals one 12 oz bottle of beer (355 mL), one 5 oz glass of wine (148 mL), or one 1 oz glass of hard liquor (44 mL). General instructions  Schedule regular health, dental, and eye exams.  Stay current with your vaccines.  Tell your health care provider if: ? You often feel depressed. ? You have ever been abused or do not feel safe at home. Summary  Adopting a healthy lifestyle and getting preventive care are important in promoting health and wellness.  Follow your health care provider's instructions about healthy  diet, exercising, and getting tested or screened for diseases.  Follow your health care provider's instructions on monitoring your cholesterol and blood pressure. This information is not intended to replace advice given to you by your health care provider. Make sure you discuss any questions you have with your health care provider. Document Released: 04/29/2011 Document Revised: 10/07/2018 Document Reviewed: 10/07/2018 Elsevier Patient Education  2020 [employer]  

## 2019-06-02 NOTE — Progress Notes (Addendum)
Subjective:     Patient ID: Julie Zamora , female    DOB: 11-08-1950 , 68 y.o.   MRN: [id number]   Chief Complaint  Patient presents with  . Annual Exam   The patient states she uses status post hysterectomy for birth control. Last LMP was No LMP recorded. Patient is postmenopausal.. Negative for Dysmenorrhea and Negative for Menorrhagia Mammogram last done 2019 - Dr. Charlesetta Garibaldi.  Negative for: breast discharge, breast lump(s), breast pain and breast self exam.  Pertinent negatives include abnormal bleeding (hematology), anxiety, decreased libido, depression, difficulty falling sleep, dyspareunia, history of infertility, nocturia, sexual dysfunction, sleep disturbances, urinary incontinence, urinary urgency, vaginal discharge and vaginal itching. Diet regular. The patient states her exercise level is      The patient's tobacco use is:  Social History   Tobacco Use  Smoking Status Never Smoker  Smokeless Tobacco Never Used   She has been exposed to passive smoke. The patient's alcohol use is:  Social History   Substance and Sexual Activity  Alcohol Use Never  . Frequency: Never     HPI  Here for HM  Wt Readings from Last 3 Encounters: 06/02/19 : 217 lb 12.8 oz (98.8 kg) 01/04/19 : 212 lb 3.2 oz (96.3 kg) 11/30/18 : 214 lb 6.4 oz (97.3 kg)   Thyroid Problem Presents for follow-up visit. Patient reports no anxiety or fatigue. The symptoms have been stable.     Past Medical History:  Diagnosis Date  . Hypothyroidism      Family History  Problem Relation Age of Onset  . Cancer Mother   . Diabetes Father   . Cancer Cousin   . Cancer Cousin      Current Outpatient Medications:  .  levothyroxine (SYNTHROID) 88 MCG tablet, TAKE 1 TABLET BY MOUTH EVERY DAY, Disp: 30 tablet, Rfl: 1   No Known Allergies   Review of Systems  Constitutional: Negative.  Negative for fatigue.  HENT: Negative.   Eyes: Negative.   Respiratory: Negative.   Cardiovascular: Negative.    Gastrointestinal: Negative.   Endocrine: Negative.   Genitourinary: Negative.   Musculoskeletal: Negative.   Skin: Negative.   Allergic/Immunologic: Negative.   Neurological: Negative.  Negative for dizziness and headaches.  Hematological: Negative.   Psychiatric/Behavioral: Negative.  The patient is not nervous/anxious.      Today's Vitals   06/02/19 1019  BP: 122/80  Pulse: (!) 51  Temp: 98.1 F (36.7 C)  TempSrc: Oral  Weight: 217 lb 12.8 oz (98.8 kg)  Height: 5' 8.4" (1.737 m)  PainSc: 0-No pain   Body mass index is 32.73 kg/m.   Objective:  Physical Exam Constitutional:      Appearance: Normal appearance. She is well-developed.  HENT:     Head: Normocephalic and atraumatic.     Right Ear: Hearing, tympanic membrane, ear canal and external ear normal. There is no impacted cerumen.     Left Ear: Hearing, tympanic membrane, ear canal and external ear normal. There is no impacted cerumen.     Mouth/Throat:     Mouth: Mucous membranes are moist.  Eyes:     General: Lids are normal.     Conjunctiva/sclera: Conjunctivae normal.     Pupils: Pupils are equal, round, and reactive to light.     Funduscopic exam:    Right eye: No papilledema.        Left eye: No papilledema.  Neck:     Musculoskeletal: Full passive range of motion without pain, normal  range of motion and neck supple.     Thyroid: No thyroid mass.     Vascular: No carotid bruit.  Cardiovascular:     Rate and Rhythm: Normal rate and regular rhythm.     Pulses: Normal pulses.     Heart sounds: Normal heart sounds. No murmur.  Pulmonary:     Effort: Pulmonary effort is normal.     Breath sounds: Normal breath sounds.  Abdominal:     General: Abdomen is flat. Bowel sounds are normal.     Palpations: Abdomen is soft.  Musculoskeletal: Normal range of motion.        General: No swelling or tenderness.     Right lower leg: No edema.     Left lower leg: No edema.  Skin:    General: Skin is warm and dry.      Capillary Refill: Capillary refill takes less than 2 seconds.  Neurological:     General: No focal deficit present.     Mental Status: She is alert and oriented to person, place, and time.     Cranial Nerves: No cranial nerve deficit.     Sensory: No sensory deficit.  Psychiatric:        Mood and Affect: Mood normal.        Behavior: Behavior normal.        Thought Content: Thought content normal.        Judgment: Judgment normal.         Assessment And Plan:     1. Encounter for general adult medical examination w/o abnormal findings . Behavior modifications discussed and diet history reviewed.   . Pt will continue to exercise regularly and modify diet with low GI, plant based foods and decrease intake of processed foods.  . Recommend intake of daily multivitamin, Vitamin D, and calcium.  . Recommend mammogram for preventive screenings, as well as recommend immunizations that include  TDAP - POCT Urinalysis Dipstick (81002) - Lipid Profile - CBC no Diff - Vitamin D (25 hydroxy) - Hemoglobin A1c  2. Mixed hyperlipidemia  Chronic, controlled  No current medications, diet controlled. - BMP8+Anion Gap  3. Acquired hypothyroidism  Chronic, controlled  Continue with current medications - TSH - T3 - T4, Free  4. Encounter for immunization  Pneumonia 23 vaccine given in office - Pneumococcal polysaccharide vaccine 23-valent greater than or equal to 2yo subcutaneous/IM  5. Encounter for screening  - HIV antibody (with reflex)      Arnette FeltsJanece Kinte Trim, FNP    THE PATIENT IS ENCOURAGED TO PRACTICE SOCIAL DISTANCING DUE TO THE COVID-19 PANDEMIC.

## 2019-06-03 LAB — BMP8+ANION GAP
Anion Gap: 15 mmol/L (ref 10.0–18.0)
BUN/Creatinine Ratio: 11 — ABNORMAL LOW (ref 12–28)
BUN: 10 mg/dL (ref 8–27)
CO2: 23 mmol/L (ref 20–29)
Calcium: 8.9 mg/dL (ref 8.7–10.3)
Chloride: 103 mmol/L (ref 96–106)
Creatinine, Ser: 0.91 mg/dL (ref 0.57–1.00)
GFR calc Af Amer: 77 mL/min/{1.73_m2} (ref >59)
GFR calc non Af Amer: 66 mL/min/{1.73_m2} (ref >59)
Glucose: 74 mg/dL (ref 65–99)
Potassium: 4.4 mmol/L (ref 3.5–5.2)
Sodium: 141 mmol/L (ref 134–144)

## 2019-06-03 LAB — HIV ANTIBODY (ROUTINE TESTING W REFLEX): HIV Screen 4th Generation wRfx: NONREACTIVE

## 2019-06-03 LAB — LIPID PANEL
Chol/HDL Ratio: 3.9 ratio (ref 0.0–4.4)
Cholesterol, Total: 209 mg/dL — ABNORMAL HIGH (ref 100–199)
HDL: 53 mg/dL (ref >39)
LDL Calculated: 137 mg/dL — ABNORMAL HIGH (ref 0–99)
Triglycerides: 94 mg/dL (ref 0–149)
VLDL Cholesterol Cal: 19 mg/dL (ref 5–40)

## 2019-06-03 LAB — CBC
Hematocrit: 40.8 % (ref 34.0–46.6)
Hemoglobin: 12.8 g/dL (ref 11.1–15.9)
MCH: 29.4 pg (ref 26.6–33.0)
MCHC: 31.4 g/dL — ABNORMAL LOW (ref 31.5–35.7)
MCV: 94 fL (ref 79–97)
Platelets: 163 10*3/uL (ref 150–450)
RBC: 4.35 x10E6/uL (ref 3.77–5.28)
RDW: 14.4 % (ref 11.7–15.4)
WBC: 3.7 10*3/uL (ref 3.4–10.8)

## 2019-06-03 LAB — HEMOGLOBIN A1C
Est. average glucose Bld gHb Est-mCnc: 117 mg/dL
Hgb A1c MFr Bld: 5.7 % — ABNORMAL HIGH (ref 4.8–5.6)

## 2019-06-03 LAB — TSH: TSH: 2.59 u[IU]/mL (ref 0.450–4.500)

## 2019-06-03 LAB — VITAMIN D 25 HYDROXY (VIT D DEFICIENCY, FRACTURES): Vit D, 25-Hydroxy: 10.3 ng/mL — ABNORMAL LOW (ref 30.0–100.0)

## 2019-06-03 LAB — T3: T3, Total: 111 ng/dL (ref 71–180)

## 2019-06-03 LAB — T4, FREE: Free T4: 1.24 ng/dL (ref 0.82–1.77)

## 2019-06-04 ENCOUNTER — Other Ambulatory Visit: Payer: Self-pay | Admitting: Nurse Practitioner

## 2019-06-04 DIAGNOSIS — E559 Vitamin D deficiency, unspecified: Secondary | ICD-10-CM

## 2019-06-04 MED ORDER — VITAMIN D (ERGOCALCIFEROL) 1.25 MG (50000 UNIT) PO CAPS: 50000.0000 [IU] | ORAL_CAPSULE | ORAL | 0 refills | Status: DC

## 2019-06-25 ENCOUNTER — Other Ambulatory Visit: Payer: Self-pay | Admitting: Nurse Practitioner

## 2019-06-25 DIAGNOSIS — E039 Hypothyroidism, unspecified: Secondary | ICD-10-CM

## 2019-09-06 ENCOUNTER — Other Ambulatory Visit: Payer: Self-pay

## 2019-09-06 DIAGNOSIS — E039 Hypothyroidism, unspecified: Secondary | ICD-10-CM

## 2019-09-06 MED ORDER — LEVOTHYROXINE SODIUM 88 MCG PO TABS: 88.0000 ug | ORAL_TABLET | Freq: Every day | ORAL | 2 refills | Status: DC

## 2019-10-07 LAB — HM PAP SMEAR

## 2019-10-07 LAB — HM DEXA SCAN: HM Dexa Scan: NORMAL

## 2019-10-07 LAB — HM MAMMOGRAPHY

## 2019-10-13 ENCOUNTER — Other Ambulatory Visit: Payer: Self-pay

## 2019-10-13 DIAGNOSIS — E559 Vitamin D deficiency, unspecified: Secondary | ICD-10-CM

## 2019-10-13 MED ORDER — VITAMIN D (ERGOCALCIFEROL) 1.25 MG (50000 UNIT) PO CAPS: 50000.0000 [IU] | ORAL_CAPSULE | ORAL | 0 refills | Status: DC

## 2019-12-06 ENCOUNTER — Ambulatory Visit: Payer: BC Managed Care – PPO | Admitting: Nurse Practitioner

## 2019-12-06 ENCOUNTER — Encounter: Payer: Self-pay | Admitting: Nurse Practitioner

## 2019-12-06 ENCOUNTER — Other Ambulatory Visit: Payer: Self-pay

## 2019-12-06 ENCOUNTER — Ambulatory Visit: Payer: BC Managed Care – PPO | Admitting: Internal Medicine

## 2019-12-06 VITALS — BP 120/78 | HR 67 | Temp 97.7°F | Ht 68.4 in | Wt 219.0 lb

## 2019-12-06 DIAGNOSIS — E782 Mixed hyperlipidemia: Secondary | ICD-10-CM

## 2019-12-06 DIAGNOSIS — E559 Vitamin D deficiency, unspecified: Secondary | ICD-10-CM | POA: Diagnosis not present

## 2019-12-06 DIAGNOSIS — E039 Hypothyroidism, unspecified: Secondary | ICD-10-CM | POA: Diagnosis not present

## 2019-12-06 NOTE — Progress Notes (Signed)
  Subjective:     Patient ID: Julie Zamora , female    DOB: August 06, 1951 , 69 y.o.   MRN: [id number]   Chief Complaint  Patient presents with  . Hypothyroidism    HPI  Thyroid Problem Presents for follow-up visit. Patient reports no anxiety, cold intolerance, fatigue, heat intolerance or palpitations. The symptoms have been stable.     Past Medical History:  Diagnosis Date  . Hypothyroidism      Family History  Problem Relation Age of Onset  . Cancer Mother   . Diabetes Father   . Cancer Cousin   . Cancer Cousin      Current Outpatient Medications:  .  levothyroxine (SYNTHROID) 88 MCG tablet, Take 1 tablet (88 mcg total) by mouth daily., Disp: 30 tablet, Rfl: 2 .  Vitamin D, Ergocalciferol, (DRISDOL) 1.25 MG (50000 UT) CAPS capsule, Take 1 capsule (50,000 Units total) by mouth 2 (two) times a week., Disp: 24 capsule, Rfl: 0   No Known Allergies   Review of Systems  Constitutional: Negative for fatigue.  Respiratory: Negative.  Negative for cough.   Cardiovascular: Negative.  Negative for chest pain, palpitations and leg swelling.  Endocrine: Negative for cold intolerance and heat intolerance.  Neurological: Negative.  Negative for dizziness and headaches.  Psychiatric/Behavioral: Negative for agitation. The patient is not nervous/anxious.      Today's Vitals   12/06/19 1018  BP: 120/78  Pulse: 67  Temp: 97.7 F (36.5 C)  TempSrc: Oral  Weight: 219 lb (99.3 kg)  Height: 5' 8.4" (1.737 m)   Body mass index is 32.91 kg/m.   Objective:  Physical Exam Constitutional:      Appearance: Normal appearance.  Cardiovascular:     Rate and Rhythm: Normal rate and regular rhythm.     Pulses: Normal pulses.     Heart sounds: Normal heart sounds. No murmur.  Pulmonary:     Effort: Pulmonary effort is normal. No respiratory distress.     Breath sounds: Normal breath sounds.  Skin:    General: Skin is warm and dry.     Comments: Left forearm burn healing with dried    Neurological:     General: No focal deficit present.     Mental Status: She is oriented to person, place, and time.  Psychiatric:        Mood and Affect: Mood normal.         Assessment And Plan:     1. Acquired hypothyroidism  Chronic, controlled  Continue with current medications  Will make any changes to medications pending lab results - TSH - T4 - T3, free - CMP14+EGFR  2. Mixed hyperlipidemia  Chronic, controlled  no current medications - Lipid panel - CMP14+EGFR  3. Vitamin D deficiency  Chronic, controlled  Continue with current medications  Encouraged to make sure she is taking her vitamin d as directed to help boost her immune system - VITAMIN D 25 Hydroxy (Vit-D Deficiency, Fractures)      Minette Brine, FNP

## 2019-12-07 LAB — VITAMIN D 25 HYDROXY (VIT D DEFICIENCY, FRACTURES): Vit D, 25-Hydroxy: 60.4 ng/mL (ref 30.0–100.0)

## 2019-12-07 LAB — LIPID PANEL
Chol/HDL Ratio: 3.9 ratio (ref 0.0–4.4)
Cholesterol, Total: 222 mg/dL — ABNORMAL HIGH (ref 100–199)
HDL: 57 mg/dL (ref >39)
LDL Chol Calc (NIH): 148 mg/dL — ABNORMAL HIGH (ref 0–99)
Triglycerides: 98 mg/dL (ref 0–149)
VLDL Cholesterol Cal: 17 mg/dL (ref 5–40)

## 2019-12-07 LAB — CMP14+EGFR
ALT: 11 IU/L (ref 0–32)
AST: 19 IU/L (ref 0–40)
Albumin/Globulin Ratio: 1.3 (ref 1.2–2.2)
Albumin: 4.2 g/dL (ref 3.8–4.8)
Alkaline Phosphatase: 80 IU/L (ref 39–117)
BUN/Creatinine Ratio: 12 (ref 12–28)
BUN: 11 mg/dL (ref 8–27)
Bilirubin Total: 0.3 mg/dL (ref 0.0–1.2)
CO2: 25 mmol/L (ref 20–29)
Calcium: 9.2 mg/dL (ref 8.7–10.3)
Chloride: 104 mmol/L (ref 96–106)
Creatinine, Ser: 0.92 mg/dL (ref 0.57–1.00)
GFR calc Af Amer: 75 mL/min/{1.73_m2} (ref >59)
GFR calc non Af Amer: 65 mL/min/{1.73_m2} (ref >59)
Globulin, Total: 3.2 g/dL (ref 1.5–4.5)
Glucose: 76 mg/dL (ref 65–99)
Potassium: 4.1 mmol/L (ref 3.5–5.2)
Sodium: 140 mmol/L (ref 134–144)
Total Protein: 7.4 g/dL (ref 6.0–8.5)

## 2019-12-07 LAB — T4: T4, Total: 9 ug/dL (ref 4.5–12.0)

## 2019-12-07 LAB — TSH: TSH: 5.03 u[IU]/mL — ABNORMAL HIGH (ref 0.450–4.500)

## 2019-12-07 LAB — T3, FREE: T3, Free: 2.3 pg/mL (ref 2.0–4.4)

## 2020-01-01 ENCOUNTER — Ambulatory Visit: Payer: BC Managed Care – PPO

## 2020-01-01 ENCOUNTER — Ambulatory Visit: Payer: BC Managed Care – PPO | Attending: Internal Medicine

## 2020-01-01 DIAGNOSIS — Z23 Encounter for immunization: Secondary | ICD-10-CM | POA: Insufficient documentation

## 2020-01-01 NOTE — Progress Notes (Signed)
   Covid-19 Vaccination Clinic  Name:  Julie Zamora    MRN: [id number] DOB: Jan 23, 1951  01/01/2020  Julie Zamora was observed post Covid-19 immunization for 15 minutes without incident. She was provided with Vaccine Information Sheet and instruction to access the V-Safe system.   Julie Zamora was instructed to call 911 with any severe reactions post vaccine: Marland Kitchen Difficulty breathing  . Swelling of face and throat  . A fast heartbeat  . A bad rash all over body  . Dizziness and weakness   Immunizations Administered    Name Date Dose VIS Date Route   Pfizer COVID-19 Vaccine 01/01/2020  1:24 PM 0.3 mL 10/08/2019 Intramuscular   Manufacturer: [employer], [employer]   Lot: [id number]   NDC: [address]-7

## 2020-01-08 ENCOUNTER — Other Ambulatory Visit: Payer: Self-pay | Admitting: Nurse Practitioner

## 2020-01-08 DIAGNOSIS — E559 Vitamin D deficiency, unspecified: Secondary | ICD-10-CM

## 2020-01-11 ENCOUNTER — Other Ambulatory Visit: Payer: Self-pay | Admitting: Nurse Practitioner

## 2020-01-11 DIAGNOSIS — E039 Hypothyroidism, unspecified: Secondary | ICD-10-CM

## 2020-01-22 ENCOUNTER — Ambulatory Visit: Payer: BC Managed Care – PPO | Attending: Internal Medicine

## 2020-01-22 DIAGNOSIS — Z23 Encounter for immunization: Secondary | ICD-10-CM

## 2020-01-22 NOTE — Progress Notes (Signed)
   Covid-19 Vaccination Clinic  Name:  Julie Zamora    MRN: [id number] DOB: 25-Jan-1951  01/22/2020  [name] was observed post Covid-19 immunization for 15 minutes without incident. She was provided with Vaccine Information Sheet and instruction to access the V-Safe system.   [name] was instructed to call 911 with any severe reactions post vaccine: Marland Kitchen Difficulty breathing  . Swelling of face and throat  . A fast heartbeat  . A bad rash all over body  . Dizziness and weakness   Immunizations Administered    Name Date Dose VIS Date Route   Pfizer COVID-19 Vaccine 01/22/2020  1:16 PM 0.3 mL 10/08/2019 Intramuscular   Manufacturer: [employer], [employer]   Lot: [id number]   NDC: [address]-8

## 2020-03-08 ENCOUNTER — Other Ambulatory Visit: Payer: Self-pay | Admitting: Nurse Practitioner

## 2020-03-08 DIAGNOSIS — E039 Hypothyroidism, unspecified: Secondary | ICD-10-CM

## 2020-04-05 ENCOUNTER — Other Ambulatory Visit: Payer: Self-pay | Admitting: Nurse Practitioner

## 2020-04-05 DIAGNOSIS — E559 Vitamin D deficiency, unspecified: Secondary | ICD-10-CM

## 2020-06-05 ENCOUNTER — Encounter: Payer: Self-pay | Admitting: Nurse Practitioner

## 2020-06-05 ENCOUNTER — Other Ambulatory Visit: Payer: Self-pay

## 2020-06-05 ENCOUNTER — Ambulatory Visit: Payer: BC Managed Care – PPO | Admitting: Nurse Practitioner

## 2020-06-05 VITALS — BP 126/78 | HR 73 | Temp 98.4°F | Ht 68.4 in | Wt 224.0 lb

## 2020-06-05 DIAGNOSIS — E039 Hypothyroidism, unspecified: Secondary | ICD-10-CM

## 2020-06-05 DIAGNOSIS — E6609 Other obesity due to excess calories: Secondary | ICD-10-CM

## 2020-06-05 DIAGNOSIS — E782 Mixed hyperlipidemia: Secondary | ICD-10-CM

## 2020-06-05 DIAGNOSIS — E559 Vitamin D deficiency, unspecified: Secondary | ICD-10-CM

## 2020-06-05 DIAGNOSIS — Z0000 Encounter for general adult medical examination without abnormal findings: Secondary | ICD-10-CM | POA: Diagnosis not present

## 2020-06-05 DIAGNOSIS — Z6833 Body mass index (BMI) 33.0-33.9, adult: Secondary | ICD-10-CM

## 2020-06-05 DIAGNOSIS — Z23 Encounter for immunization: Secondary | ICD-10-CM

## 2020-06-05 MED ORDER — PNEUMOCOCCAL 13-VAL CONJ VACC IM SUSP: 0.5000 mL | INTRAMUSCULAR | 0 refills | Status: AC

## 2020-06-05 NOTE — Progress Notes (Signed)
This visit occurred during the SARS-CoV-2 public health emergency.  Safety protocols were in place, including screening questions prior to the visit, additional usage of staff PPE, and extensive cleaning of exam room while observing appropriate contact time as indicated for disinfecting solutions.  Subjective:     Patient ID: Julie Zamora , female    DOB: 08-11-1951 , 69 y.o.   MRN: [id number]   Chief Complaint  Patient presents with  . 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   Wt Readings from Last 3 Encounters: 06/05/20 : 224 lb (101.6 kg) 12/06/19 : 219 lb (99.3 kg) 06/02/19 : 217 lb 12.8 oz (98.8 kg)  Thyroid Problem Presents for follow-up visit. Patient reports no anxiety, fatigue, palpitations or weight gain. The symptoms have been stable.     Past Medical History:  Diagnosis Date  . Hypothyroidism      Family History  Problem Relation Age of Onset  . Cancer Mother   . Diabetes Father   . Cancer Cousin   . Cancer Cousin      Current Outpatient Medications:  .  levothyroxine (SYNTHROID) 88 MCG tablet, TAKE 1 TABLET(88 MCG) BY MOUTH DAILY, Disp: 30 tablet, Rfl: 2 .  Vitamin D, Ergocalciferol, (DRISDOL) 1.25 MG (50000 UNIT) CAPS capsule, TAKE 1 CAPSULE BY MOUTH 2 TIMES A WEEK, Disp: 24 capsule, Rfl: 0 .  pneumococcal 13-valent conjugate vaccine (PREVNAR 13) SUSP injection, Inject 0.5 mLs into the muscle tomorrow at 10 am for 1 dose., Disp: 0.5 mL, Rfl: 0   No Known Allergies    The patient states she post menopausal status .  Denies breakthrough bleeding, reports in the last 3 months she had a spotting one time but has not done it since. Negative for: breast discharge, breast lump(s), breast pain and breast self exam. Associated symptoms include abnormal vaginal bleeding. Pertinent negatives include abnormal bleeding (hematology), anxiety, decreased libido, depression, difficulty falling sleep, dyspareunia, history of infertility,  nocturia, sexual dysfunction, sleep disturbances, urinary incontinence, urinary urgency, vaginal discharge and vaginal itching. Diet regular. The patient states her exercise level is minimal.  She does report she will cut grass with a self propel mower.    The patient's tobacco use is:  Social History   Tobacco Use  Smoking Status Never Smoker  Smokeless Tobacco Never Used   She has been exposed to passive smoke. The patient's alcohol use is:  Social History   Substance and Sexual Activity  Alcohol Use Never   Additional information: Last pap - Dr. Charlesetta Garibaldi did last year  Review of Systems  Constitutional: Negative.  Negative for fatigue and weight gain.  HENT: Negative.   Eyes: Negative.   Respiratory: Negative.   Cardiovascular: Negative.  Negative for palpitations.  Gastrointestinal: Negative.   Endocrine: Negative.   Genitourinary: Negative.   Musculoskeletal: Negative.   Skin: Negative.   Allergic/Immunologic: Negative.   Neurological: Negative.   Hematological: Negative.   Psychiatric/Behavioral: Negative.  The patient is not nervous/anxious.      Today's Vitals   06/05/20 1015  BP: 126/78  Pulse: 73  Temp: 98.4 F (36.9 C)  TempSrc: Oral  Weight: 224 lb (101.6 kg)  Height: 5' 8.4" (1.737 m)  PainSc: 0-No pain   Body mass index is 33.66 kg/m.   Objective:  Physical Exam Vitals reviewed.  Constitutional:      General: She is not in acute distress.    Appearance: Normal appearance. She is well-developed.  HENT:     Head: Normocephalic and atraumatic.     Right Ear: Hearing, tympanic membrane, ear canal and external ear normal. There is no impacted cerumen.     Left Ear: Hearing, tympanic membrane, ear canal and external ear normal. There is no impacted cerumen.     Nose:     Comments: Deferred - masked    Mouth/Throat:     Comments: Deferred - masked Eyes:     General: Lids are normal.     Extraocular Movements: Extraocular movements intact.      Conjunctiva/sclera: Conjunctivae normal.     Pupils: Pupils are equal, round, and reactive to light.     Funduscopic exam:    Right eye: No papilledema.        Left eye: No papilledema.  Neck:     Thyroid: No thyroid mass.     Vascular: No carotid bruit.  Cardiovascular:     Rate and Rhythm: Normal rate and regular rhythm.     Pulses: Normal pulses.     Heart sounds: Normal heart sounds. No murmur heard.   Pulmonary:     Effort: Pulmonary effort is normal.     Breath sounds: Normal breath sounds.  Abdominal:     General: Abdomen is flat. Bowel sounds are normal.     Palpations: Abdomen is soft.  Musculoskeletal:        General: No swelling or tenderness. Normal range of motion.     Cervical back: Full passive range of motion without pain, normal range of motion and neck supple.     Right lower leg: No edema.     Left lower leg: No edema.  Skin:    General: Skin is warm and dry.     Capillary Refill: Capillary refill takes less than 2 seconds.  Neurological:     General: No focal deficit present.     Mental Status: She is alert and oriented to person, place, and time.     Cranial Nerves: No cranial nerve deficit.     Sensory: No sensory deficit.  Psychiatric:        Mood and Affect: Mood normal.        Behavior: Behavior normal.        Thought Content: Thought content normal.        Judgment: Judgment normal.         Assessment And Plan:     1. Encounter for general adult medical examination w/o abnormal findings . Behavior modifications discussed and diet history reviewed.   . Pt will continue to exercise regularly and modify diet with low GI, plant based foods and decrease intake of processed foods.  . Recommend intake of daily multivitamin, Vitamin D, and calcium.  . Recommend mammogram (done at her GYN - will get record) for preventive screenings, as well as recommend immunizations that include influenza (will add to list) TDAP ( up to date) - CMP14+EGFR - CBC -  Hemoglobin A1c  2. Acquired hypothyroidism  Chronic, controlled  Continue with current medications - TSH - T3, free - T4  3. Mixed hyperlipidemia  Chronic, no current medications this is diet and exercise controlled - Lipid panel  4. Vitamin D deficiency  Will check vitamin D level and supplement as needed.     Also encouraged to spend 15 minutes in the sun daily.  - VITAMIN D 25 Hydroxy (Vit-D Deficiency, Fractures)  5. Encounter for immunization  rx sent to pharmacy - pneumococcal 13-valent conjugate vaccine (  PREVNAR 13) SUSP injection; Inject 0.5 mLs into the muscle tomorrow at 10 am for 1 dose.  Dispense: 0.5 mL; Refill: 0  6. Class 1 obesity due to excess calories without serious comorbidity with body mass index (BMI) of 33.0 to 33.9 in adult  Chronic  Discussed healthy diet and regular exercise options   Encouraged to exercise at least 150 minutes per week with 2 days of strength training  Given information on foods to eat and avoid for weight loss/healthy eating Encouraged to park further when at the store, take stairs instead of elevators and to walk in place during commercials. Increase water intake to at least one gallon of water daily.     Patient was given opportunity to ask questions. Patient verbalized understanding of the plan and was able to repeat key elements of the plan. All questions were answered to their satisfaction.   Minette Brine, FNP   I, Minette Brine, FNP, have reviewed all documentation for this visit. The documentation on 06/05/20 for the exam, diagnosis, procedures, and orders are all accurate and complete.  THE PATIENT IS ENCOURAGED TO PRACTICE SOCIAL DISTANCING DUE TO THE COVID-19 PANDEMIC.

## 2020-06-05 NOTE — Patient Instructions (Addendum)
Health Maintenance After Age 67 After age 39, you are at a higher risk for certain long-term diseases and infections as well as injuries from falls. Falls are a major cause of broken bones and head injuries in people who are older than age 66. Getting regular preventive care can help to keep you healthy and well. Preventive care includes getting regular testing and making lifestyle changes as recommended by your health care provider. Talk with your health care provider about:  Which screenings and tests you should have. A screening is a test that checks for a disease when you have no symptoms.  A diet and exercise plan that is right for you. What should I know about screenings and tests to prevent falls? Screening and testing are the best ways to find a health problem early. Early diagnosis and treatment give you the best chance of managing medical conditions that are common after age 8. Certain conditions and lifestyle choices may make you more likely to have a fall. Your health care provider may recommend:  Regular vision checks. Poor vision and conditions such as cataracts can make you more likely to have a fall. If you wear glasses, make sure to get your prescription updated if your vision changes.  Medicine review. Work with your health care provider to regularly review all of the medicines you are taking, including over-the-counter medicines. Ask your health care provider about any side effects that may make you more likely to have a fall. Tell your health care provider if any medicines that you take make you feel dizzy or sleepy.  Osteoporosis screening. Osteoporosis is a condition that causes the bones to get weaker. This can make the bones weak and cause them to break more easily.  Blood pressure screening. Blood pressure changes and medicines to control blood pressure can make you feel dizzy.  Strength and balance checks. Your health care provider may recommend certain tests to check your  strength and balance while standing, walking, or changing positions.  Foot health exam. Foot pain and numbness, as well as not wearing proper footwear, can make you more likely to have a fall.  Depression screening. You may be more likely to have a fall if you have a fear of falling, feel emotionally low, or feel unable to do activities that you used to do.  Alcohol use screening. Using too much alcohol can affect your balance and may make you more likely to have a fall. What actions can I take to lower my risk of falls? General instructions  Talk with your health care provider about your risks for falling. Tell your health care provider if: ? You fall. Be sure to tell your health care provider about all falls, even ones that seem minor. ? You feel dizzy, sleepy, or off-balance.  Take over-the-counter and prescription medicines only as told by your health care provider. These include any supplements.  Eat a healthy diet and maintain a healthy weight. A healthy diet includes low-fat dairy products, low-fat (lean) meats, and fiber from whole grains, beans, and lots of fruits and vegetables. Home safety  Remove any tripping hazards, such as rugs, cords, and clutter.  Install safety equipment such as grab bars in bathrooms and safety rails on stairs.  Keep rooms and walkways well-lit. Activity   Follow a regular exercise program to stay fit. This will help you maintain your balance. Ask your health care provider what types of exercise are appropriate for you.  If you need a cane or  walker, use it as recommended by your health care provider.  Wear supportive shoes that have nonskid soles. Lifestyle  Do not drink alcohol if your health care provider tells you not to drink.  If you drink alcohol, limit how much you have: ? 0-1 drink a day for women. ? 0-2 drinks a day for men.  Be aware of how much alcohol is in your drink. In the [place] one drink equals one typical bottle of beer (12  oz), one-half glass of wine (5 oz), or one shot of hard liquor (1 oz).  Do not use any products that contain nicotine or tobacco, such as cigarettes and e-cigarettes. If you need help quitting, ask your health care provider. Summary  Having a healthy lifestyle and getting preventive care can help to protect your health and wellness after age 66.  Screening and testing are the best way to find a health problem early and help you avoid having a fall. Early diagnosis and treatment give you the best chance for managing medical conditions that are more common for people who are older than age 13.  Falls are a major cause of broken bones and head injuries in people who are older than age 35. Take precautions to prevent a fall at home.  Work with your health care provider to learn what changes you can make to improve your health and wellness and to prevent falls. This information is not intended to replace advice given to you by your health care provider. Make sure you discuss any questions you have with your health care provider. Document Revised: 02/04/2019 Document Reviewed: 08/27/2017 Elsevier Patient Education  [place] your lunch and dinner items to keep a low carb intake in the evening.    Healthy Eating Following a healthy eating pattern may help you to achieve and maintain a healthy body weight, reduce the risk of chronic disease, and live a long and productive life. It is important to follow a healthy eating pattern at an appropriate calorie level for your body. Your nutritional needs should be met primarily through food by choosing a variety of nutrient-rich foods. What are tips for following this plan? Reading food labels  Read labels and choose the following: ? Reduced or low sodium. ? Juices with 100% fruit juice. ? Foods with low saturated fats and high polyunsaturated and monounsaturated fats. ? Foods with whole grains, such as whole wheat, cracked wheat, brown  rice, and wild rice. ? Whole grains that are fortified with folic acid. This is recommended for women who are pregnant or who want to become pregnant.  Read labels and avoid the following: ? Foods with a lot of added sugars. These include foods that contain brown sugar, corn sweetener, corn syrup, dextrose, fructose, glucose, high-fructose corn syrup, honey, invert sugar, lactose, malt syrup, maltose, molasses, raw sugar, sucrose, trehalose, or turbinado sugar.  Do not eat more than the following amounts of added sugar per day:  6 teaspoons (25 g) for women.  9 teaspoons (38 g) for men. ? Foods that contain processed or refined starches and grains. ? Refined grain products, such as white flour, degermed cornmeal, white bread, and white rice. Shopping  Choose nutrient-rich snacks, such as vegetables, whole fruits, and nuts. Avoid high-calorie and high-sugar snacks, such as potato chips, fruit snacks, and candy.  Use oil-based dressings and spreads on foods instead of solid fats such as butter, stick margarine, or cream cheese.  Limit pre-made sauces, mixes, and "instant" products  such as flavored rice, instant noodles, and ready-made pasta.  Try more plant-protein sources, such as tofu, tempeh, black beans, edamame, lentils, nuts, and seeds.  Explore eating plans such as the Mediterranean diet or vegetarian diet. Cooking  Use oil to saut or stir-fry foods instead of solid fats such as butter, stick margarine, or lard.  Try baking, boiling, grilling, or broiling instead of frying.  Remove the fatty part of meats before cooking.  Steam vegetables in water or broth. Meal planning   At meals, imagine dividing your plate into fourths: ? One-half of your plate is fruits and vegetables. ? One-fourth of your plate is whole grains. ? One-fourth of your plate is protein, especially lean meats, poultry, eggs, tofu, beans, or nuts.  Include low-fat dairy as part of your daily  diet. Lifestyle  Choose healthy options in all settings, including home, work, school, restaurants, or stores.  Prepare your food safely: ? Wash your hands after handling raw meats. ? Keep food preparation surfaces clean by regularly washing with hot, soapy water. ? Keep raw meats separate from ready-to-eat foods, such as fruits and vegetables. ? Cook seafood, meat, poultry, and eggs to the recommended internal temperature. ? Store foods at safe temperatures. In general:  Keep cold foods at 28F (4.4C) or below.  Keep hot foods at 128F (60C) or above.  Keep your freezer at [facility] (-17.8C) or below.  Foods are no longer safe to eat when they have been between the temperatures of 40-128F (4.4-60C) for more than 2 hours. What foods should I eat? Fruits Aim to eat 2 cup-equivalents of fresh, canned (in natural juice), or frozen fruits each day. Examples of 1 cup-equivalent of fruit include 1 small apple, 8 large strawberries, 1 cup canned fruit,  cup dried fruit, or 1 cup 100% juice. Vegetables Aim to eat 2-3 cup-equivalents of fresh and frozen vegetables each day, including different varieties and colors. Examples of 1 cup-equivalent of vegetables include 2 medium carrots, 2 cups raw, leafy greens, 1 cup chopped vegetable (raw or cooked), or 1 medium baked potato. Grains Aim to eat 6 ounce-equivalents of whole grains each day. Examples of 1 ounce-equivalent of grains include 1 slice of bread, 1 cup ready-to-eat cereal, 3 cups popcorn, or  cup cooked rice, pasta, or cereal. Meats and other proteins Aim to eat 5-6 ounce-equivalents of protein each day. Examples of 1 ounce-equivalent of protein include 1 egg, 1/2 cup nuts or seeds, or 1 tablespoon (16 g) peanut butter. A cut of meat or fish that is the size of a deck of cards is about 3-4 ounce-equivalents.  Of the protein you eat each week, try to have at least 8 ounces come from seafood. This includes salmon, trout, herring, and  anchovies. Dairy Aim to eat 3 cup-equivalents of fat-free or low-fat dairy each day. Examples of 1 cup-equivalent of dairy include 1 cup (240 mL) milk, 8 ounces (250 g) yogurt, 1 ounces (44 g) natural cheese, or 1 cup (240 mL) fortified soy milk. Fats and oils  Aim for about 5 teaspoons (21 g) per day. Choose monounsaturated fats, such as canola and olive oils, avocados, peanut butter, and most nuts, or polyunsaturated fats, such as sunflower, corn, and soybean oils, walnuts, pine nuts, sesame seeds, sunflower seeds, and flaxseed. Beverages  Aim for six 8-oz glasses of water per day. Limit coffee to three to five 8-oz cups per day.  Limit caffeinated beverages that have added calories, such as soda and energy drinks.  Limit  alcohol intake to no more than 1 drink a day for nonpregnant women and 2 drinks a day for men. One drink equals 12 oz of beer (355 mL), 5 oz of wine (148 mL), or 1 oz of hard liquor (44 mL). Seasoning and other foods  Avoid adding excess amounts of salt to your foods. Try flavoring foods with herbs and spices instead of salt.  Avoid adding sugar to foods.  Try using oil-based dressings, sauces, and spreads instead of solid fats. This information is based on general [place] nutrition guidelines. For more information, visit [employer]. Exact amounts may vary based on your nutrition needs. Summary  A healthy eating plan may help you to maintain a healthy weight, reduce the risk of chronic diseases, and stay active throughout your life.  Plan your meals. Make sure you eat the right portions of a variety of nutrient-rich foods.  Try baking, boiling, grilling, or broiling instead of frying.  Choose healthy options in all settings, including home, work, school, restaurants, or stores. This information is not intended to replace advice given to you by your health care provider. Make sure you discuss any questions you have with your health care provider. Document  Revised: 01/26/2018 Document Reviewed: 01/26/2018 Elsevier Patient Education  [place].

## 2020-06-06 LAB — LIPID PANEL
Chol/HDL Ratio: 4.3 ratio (ref 0.0–4.4)
Cholesterol, Total: 221 mg/dL — ABNORMAL HIGH (ref 100–199)
HDL: 51 mg/dL (ref >39)
LDL Chol Calc (NIH): 149 mg/dL — ABNORMAL HIGH (ref 0–99)
Triglycerides: 115 mg/dL (ref 0–149)
VLDL Cholesterol Cal: 21 mg/dL (ref 5–40)

## 2020-06-06 LAB — CMP14+EGFR
ALT: 13 IU/L (ref 0–32)
AST: 18 IU/L (ref 0–40)
Albumin/Globulin Ratio: 1.2 (ref 1.2–2.2)
Albumin: 4 g/dL (ref 3.8–4.8)
Alkaline Phosphatase: 74 IU/L (ref 48–121)
BUN/Creatinine Ratio: 10 — ABNORMAL LOW (ref 12–28)
BUN: 10 mg/dL (ref 8–27)
Bilirubin Total: 0.4 mg/dL (ref 0.0–1.2)
CO2: 26 mmol/L (ref 20–29)
Calcium: 9.3 mg/dL (ref 8.7–10.3)
Chloride: 104 mmol/L (ref 96–106)
Creatinine, Ser: 0.99 mg/dL (ref 0.57–1.00)
GFR calc Af Amer: 69 mL/min/{1.73_m2} (ref >59)
GFR calc non Af Amer: 60 mL/min/{1.73_m2} (ref >59)
Globulin, Total: 3.3 g/dL (ref 1.5–4.5)
Glucose: 70 mg/dL (ref 65–99)
Potassium: 5.2 mmol/L (ref 3.5–5.2)
Sodium: 141 mmol/L (ref 134–144)
Total Protein: 7.3 g/dL (ref 6.0–8.5)

## 2020-06-06 LAB — CBC
Hematocrit: 37.2 % (ref 34.0–46.6)
Hemoglobin: 12.3 g/dL (ref 11.1–15.9)
MCH: 30.1 pg (ref 26.6–33.0)
MCHC: 33.1 g/dL (ref 31.5–35.7)
MCV: 91 fL (ref 79–97)
Platelets: 170 10*3/uL (ref 150–450)
RBC: 4.09 x10E6/uL (ref 3.77–5.28)
RDW: 14.1 % (ref 11.7–15.4)
WBC: 3.8 10*3/uL (ref 3.4–10.8)

## 2020-06-06 LAB — HEMOGLOBIN A1C
Est. average glucose Bld gHb Est-mCnc: 117 mg/dL
Hgb A1c MFr Bld: 5.7 % — ABNORMAL HIGH (ref 4.8–5.6)

## 2020-06-06 LAB — TSH: TSH: 4.28 u[IU]/mL (ref 0.450–4.500)

## 2020-06-06 LAB — VITAMIN D 25 HYDROXY (VIT D DEFICIENCY, FRACTURES): Vit D, 25-Hydroxy: 55.8 ng/mL (ref 30.0–100.0)

## 2020-06-06 LAB — T4: T4, Total: 8.8 ug/dL (ref 4.5–12.0)

## 2020-06-06 LAB — T3, FREE: T3, Free: 2.6 pg/mL (ref 2.0–4.4)

## 2020-06-08 ENCOUNTER — Encounter: Payer: Self-pay | Admitting: Nurse Practitioner

## 2020-06-12 ENCOUNTER — Telehealth: Payer: Self-pay

## 2020-06-12 NOTE — Telephone Encounter (Signed)
Added Vitamin C to pt medication list

## 2020-07-04 ENCOUNTER — Other Ambulatory Visit: Payer: Self-pay | Admitting: Nurse Practitioner

## 2020-07-04 DIAGNOSIS — E039 Hypothyroidism, unspecified: Secondary | ICD-10-CM

## 2020-07-07 ENCOUNTER — Other Ambulatory Visit: Payer: Self-pay

## 2020-07-07 DIAGNOSIS — E559 Vitamin D deficiency, unspecified: Secondary | ICD-10-CM

## 2020-07-07 MED ORDER — VITAMIN D (ERGOCALCIFEROL) 1.25 MG (50000 UNIT) PO CAPS: ORAL_CAPSULE | ORAL | 0 refills | Status: DC

## 2020-08-28 DIAGNOSIS — N950 Postmenopausal bleeding: Secondary | ICD-10-CM | POA: Insufficient documentation

## 2020-10-22 ENCOUNTER — Other Ambulatory Visit: Payer: Self-pay | Admitting: Nurse Practitioner

## 2020-10-22 DIAGNOSIS — E559 Vitamin D deficiency, unspecified: Secondary | ICD-10-CM

## 2020-11-22 ENCOUNTER — Other Ambulatory Visit: Payer: Self-pay | Admitting: Nurse Practitioner

## 2020-11-22 DIAGNOSIS — E039 Hypothyroidism, unspecified: Secondary | ICD-10-CM

## 2020-12-05 ENCOUNTER — Other Ambulatory Visit: Payer: Self-pay

## 2020-12-05 ENCOUNTER — Ambulatory Visit: Payer: BC Managed Care – PPO | Admitting: Nurse Practitioner

## 2020-12-05 ENCOUNTER — Encounter: Payer: Self-pay | Admitting: Nurse Practitioner

## 2020-12-05 VITALS — BP 124/80 | HR 64 | Temp 97.8°F | Ht 68.4 in | Wt 221.2 lb

## 2020-12-05 DIAGNOSIS — E039 Hypothyroidism, unspecified: Secondary | ICD-10-CM | POA: Diagnosis not present

## 2020-12-05 DIAGNOSIS — Z2821 Immunization not carried out because of patient refusal: Secondary | ICD-10-CM

## 2020-12-05 DIAGNOSIS — Z23 Encounter for immunization: Secondary | ICD-10-CM | POA: Diagnosis not present

## 2020-12-05 DIAGNOSIS — N939 Abnormal uterine and vaginal bleeding, unspecified: Secondary | ICD-10-CM | POA: Diagnosis not present

## 2020-12-05 MED ORDER — PNEUMOCOCCAL 13-VAL CONJ VACC IM SUSP
0.5000 mL | INTRAMUSCULAR | 0 refills | Status: AC
Start: 2020-12-05 — End: 2020-12-05

## 2020-12-05 NOTE — Progress Notes (Signed)
I,Yamilka Roman [employer] as a Neurosurgeon for [employer], FNP.,have documented all relevant documentation on the behalf of Arnette Felts, FNP,as directed by  Arnette Felts, FNP while in the presence of Arnette Felts, FNP. This visit occurred during the SARS-CoV-2 public health emergency.  Safety protocols were in place, including screening questions prior to the visit, additional usage of staff PPE, and extensive cleaning of exam room while observing appropriate contact time as indicated for disinfecting solutions.  Subjective:     Patient ID: Julie Zamora , female    DOB: 1951/02/24 , 70 y.o.   MRN: [id number]   Chief Complaint  Patient presents with  . abnormal vaginal bleeding    Patient stated she has been having some abnormal bleeding for the past couple of months. She was put on medroxyprogesterone 100mg  and Is scheduled for an ultrasound.   . Hypothyroidism    HPI  Patient presents today for some abnormal vaginal bleeding.  She seen her GYN last month (Dr. ), she was having spotting in November, it then increased at the end of January.  This weekend after church she began having bleeding and clotting as if she had a cycle.  Patient also is here for a 6 month f/u on her thyroid.  She is scheduled to have an ultrasound on February 28th.  She will likely have a biopsy as well. She is taking medrol for 10 days. On Sunday when she had the vaginal bleeding on the back of her head she feels like something different (vibration on right side radiating on left at the base of her head but now not having any symptoms)  Wt Readings from Last 3 Encounters: 12/05/20 : 221 lb 3.2 oz (100.3 kg) 06/05/20 : 224 lb (101.6 kg) 12/06/19 : 219 lb (99.3 kg)  She has been trying to eat more healthy. She is drinking water and avoiding soda.      Past Medical History:  Diagnosis Date  . Hypothyroidism      Family History  Problem Relation Age of Onset  . Cancer Mother   . Diabetes Father   .  Cancer Cousin   . Cancer Cousin      Current Outpatient Medications:  .  levothyroxine (SYNTHROID) 88 MCG tablet, TAKE 1 TABLET(88 MCG) BY MOUTH DAILY, Disp: 30 tablet, Rfl: 2 .  medroxyPROGESTERone (PROVERA) 10 MG tablet, Take 10 mg by mouth daily. Take one tablet daily for 10 days, Disp: , Rfl:  .  Vitamin D, Ergocalciferol, (DRISDOL) 1.25 MG (50000 UNIT) CAPS capsule, TAKE 1 CAPSULE BY MOUTH 2 TIMES A WEEK, Disp: 24 capsule, Rfl: 0   No Known Allergies   Review of Systems  Constitutional: Negative.   HENT: Negative.   Eyes: Negative.   Respiratory: Negative.   Cardiovascular: Negative.   Gastrointestinal: Negative.   Endocrine: Negative.   Genitourinary: Positive for vaginal bleeding. Negative for pelvic pain.  Musculoskeletal: Negative.   Neurological: Negative.   Hematological: Negative.   Psychiatric/Behavioral: Negative.      Today's Vitals   12/05/20 1127  BP: 124/80  Pulse: 64  Temp: 97.8 F (36.6 C)  TempSrc: Oral  Weight: 221 lb 3.2 oz (100.3 kg)  Height: 5' 8.4" (1.737 m)  PainSc: 2    Body mass index is 33.24 kg/m.   Objective:  Physical Exam Constitutional:      General: She is not in acute distress.    Appearance: Normal appearance. She is obese.  Cardiovascular:     Rate and  Rhythm: Normal rate and regular rhythm.     Pulses: Normal pulses.     Heart sounds: Normal heart sounds.  Pulmonary:     Effort: Pulmonary effort is normal. No respiratory distress.     Breath sounds: Normal breath sounds. No wheezing.  Musculoskeletal:        General: Normal range of motion.     Cervical back: Normal range of motion and neck supple.  Skin:    General: Skin is warm and dry.     Capillary Refill: Capillary refill takes less than 2 seconds.  Neurological:     General: No focal deficit present.     Mental Status: She is alert and oriented to person, place, and time.  Psychiatric:        Mood and Affect: Mood normal.        Behavior: Behavior normal.         Thought Content: Thought content normal.        Judgment: Judgment normal.         Assessment And Plan:     1. Acquired hypothyroidism  Chronic, stable  Continue with current medications - TSH - T3, free - T4  2. Influenza vaccination declined Patient declined influenza vaccination at this time. Patient is aware that influenza vaccine prevents illness in 70% of healthy people, and reduces hospitalizations to 30-70% in elderly. This vaccine is recommended annually. Pt is willing to accept risk associated with refusing vaccination.  3. Abnormal vaginal bleeding  She has already been seen by GYN and is scheduled to have an ultrasound  She has been taking medroxyprogesterone - CBC  4. Encounter for immunization  rx sent to pharmacy - pneumococcal 13-valent conjugate vaccine (PREVNAR 13) SUSP injection; Inject 0.5 mLs into the muscle tomorrow at 10 am for 1 dose.  Dispense: 0.5 mL; Refill: 0     Patient was given opportunity to ask questions. Patient verbalized understanding of the plan and was able to repeat key elements of the plan. All questions were answered to their satisfaction.  Arnette Felts, FNP   I, Arnette Felts, FNP, have reviewed all documentation for this visit. The documentation on 12/05/20 for the exam, diagnosis, procedures, and orders are all accurate and complete.   THE PATIENT IS ENCOURAGED TO PRACTICE SOCIAL DISTANCING DUE TO THE COVID-19 PANDEMIC.

## 2020-12-06 ENCOUNTER — Ambulatory Visit: Payer: BC Managed Care – PPO | Admitting: Internal Medicine

## 2020-12-06 LAB — CBC
Hematocrit: 35 % (ref 34.0–46.6)
Hemoglobin: 11.6 g/dL (ref 11.1–15.9)
MCH: 30.1 pg (ref 26.6–33.0)
MCHC: 33.1 g/dL (ref 31.5–35.7)
MCV: 91 fL (ref 79–97)
Platelets: 174 10*3/uL (ref 150–450)
RBC: 3.85 x10E6/uL (ref 3.77–5.28)
RDW: 13.9 % (ref 11.7–15.4)
WBC: 4.3 10*3/uL (ref 3.4–10.8)

## 2020-12-06 LAB — T4: T4, Total: 9.1 ug/dL (ref 4.5–12.0)

## 2020-12-06 LAB — T3, FREE: T3, Free: 2.4 pg/mL (ref 2.0–4.4)

## 2020-12-06 LAB — TSH: TSH: 3.29 u[IU]/mL (ref 0.450–4.500)

## 2021-02-01 ENCOUNTER — Other Ambulatory Visit: Payer: Self-pay | Admitting: Nurse Practitioner

## 2021-02-01 DIAGNOSIS — E559 Vitamin D deficiency, unspecified: Secondary | ICD-10-CM

## 2021-02-02 ENCOUNTER — Other Ambulatory Visit: Payer: Self-pay | Admitting: Nurse Practitioner

## 2021-02-02 DIAGNOSIS — E559 Vitamin D deficiency, unspecified: Secondary | ICD-10-CM

## 2021-03-13 ENCOUNTER — Other Ambulatory Visit: Payer: Self-pay | Admitting: Internal Medicine

## 2021-03-13 DIAGNOSIS — E039 Hypothyroidism, unspecified: Secondary | ICD-10-CM

## 2021-03-29 ENCOUNTER — Other Ambulatory Visit: Payer: Self-pay | Admitting: Internal Medicine

## 2021-03-29 DIAGNOSIS — E039 Hypothyroidism, unspecified: Secondary | ICD-10-CM

## 2021-06-11 ENCOUNTER — Other Ambulatory Visit: Payer: Self-pay

## 2021-06-11 ENCOUNTER — Ambulatory Visit (INDEPENDENT_AMBULATORY_CARE_PROVIDER_SITE_OTHER): Payer: BC Managed Care – PPO | Admitting: Nurse Practitioner

## 2021-06-11 ENCOUNTER — Encounter: Payer: Self-pay | Admitting: Nurse Practitioner

## 2021-06-11 VITALS — BP 128/78 | HR 64 | Temp 97.7°F | Ht 66.8 in | Wt 218.6 lb

## 2021-06-11 DIAGNOSIS — E782 Mixed hyperlipidemia: Secondary | ICD-10-CM | POA: Diagnosis not present

## 2021-06-11 DIAGNOSIS — Z0000 Encounter for general adult medical examination without abnormal findings: Secondary | ICD-10-CM | POA: Diagnosis not present

## 2021-06-11 DIAGNOSIS — E039 Hypothyroidism, unspecified: Secondary | ICD-10-CM

## 2021-06-11 DIAGNOSIS — E6609 Other obesity due to excess calories: Secondary | ICD-10-CM

## 2021-06-11 DIAGNOSIS — R7309 Other abnormal glucose: Secondary | ICD-10-CM

## 2021-06-11 DIAGNOSIS — Z23 Encounter for immunization: Secondary | ICD-10-CM

## 2021-06-11 DIAGNOSIS — R21 Rash and other nonspecific skin eruption: Secondary | ICD-10-CM

## 2021-06-11 DIAGNOSIS — E559 Vitamin D deficiency, unspecified: Secondary | ICD-10-CM | POA: Diagnosis not present

## 2021-06-11 DIAGNOSIS — Z6834 Body mass index (BMI) 34.0-34.9, adult: Secondary | ICD-10-CM

## 2021-06-11 MED ORDER — ZOSTER VAC RECOMB ADJUVANTED 50 MCG/0.5ML IM SUSR: 0.5000 mL | Freq: Once | INTRAMUSCULAR | 0 refills | Status: AC

## 2021-06-11 NOTE — Patient Instructions (Signed)
Health Maintenance, Female Adopting a healthy lifestyle and getting preventive care are important in promoting health and wellness. Ask your health care provider about: The right schedule for you to have regular tests and exams. Things you can do on your own to prevent diseases and keep yourself healthy. What should I know about diet, weight, and exercise? Eat a healthy diet  Eat a diet that includes plenty of vegetables, fruits, low-fat dairy products, and lean protein. Do not eat a lot of foods that are high in solid fats, added sugars, or sodium.  Maintain a healthy weight Body mass index (BMI) is used to identify weight problems. It estimates body fat based on height and weight. Your health care provider can help determineyour BMI and help you achieve or maintain a healthy weight. Get regular exercise Get regular exercise. This is one of the most important things you can do for your health. Most adults should: Exercise for at least 150 minutes each week. The exercise should increase your heart rate and make you sweat (moderate-intensity exercise). Do strengthening exercises at least twice a week. This is in addition to the moderate-intensity exercise. Spend less time sitting. Even light physical activity can be beneficial. Watch cholesterol and blood lipids Have your blood tested for lipids and cholesterol at 68 years of age, then havethis test every 5 years. Have your cholesterol levels checked more often if: Your lipid or cholesterol levels are high. You are older than 68 years of age. You are at high risk for heart disease. What should I know about cancer screening? Depending on your health history and family history, you may need to have cancer screening at various ages. This may include screening for: Breast cancer. Cervical cancer. Colorectal cancer. Skin cancer. Lung cancer. What should I know about heart disease, diabetes, and high blood pressure? Blood pressure and heart  disease High blood pressure causes heart disease and increases the risk of stroke. This is more likely to develop in people who have high blood pressure readings, are of African descent, or are overweight. Have your blood pressure checked: Every 3-5 years if you are 18-39 years of age. Every year if you are 40 years old or older. Diabetes Have regular diabetes screenings. This checks your fasting blood sugar level. Have the screening done: Once every three years after age 40 if you are at a normal weight and have a low risk for diabetes. More often and at a younger age if you are overweight or have a high risk for diabetes. What should I know about preventing infection? Hepatitis B If you have a higher risk for hepatitis B, you should be screened for this virus. Talk with your health care provider to find out if you are at risk forhepatitis B infection. Hepatitis C Testing is recommended for: Everyone born from 1945 through 1965. Anyone with known risk factors for hepatitis C. Sexually transmitted infections (STIs) Get screened for STIs, including gonorrhea and chlamydia, if: You are sexually active and are younger than 68 years of age. You are older than 68 years of age and your health care provider tells you that you are at risk for this type of infection. Your sexual activity has changed since you were last screened, and you are at increased risk for chlamydia or gonorrhea. Ask your health care provider if you are at risk. Ask your health care provider about whether you are at high risk for HIV. Your health care provider may recommend a prescription medicine to help   prevent HIV infection. If you choose to take medicine to prevent HIV, you should first get tested for HIV. You should then be tested every 3 months for as long as you are taking the medicine. Pregnancy If you are about to stop having your period (premenopausal) and you may become pregnant, seek counseling before you get  pregnant. Take 400 to 800 micrograms (mcg) of folic acid every day if you become pregnant. Ask for birth control (contraception) if you want to prevent pregnancy. Osteoporosis and menopause Osteoporosis is a disease in which the bones lose minerals and strength with aging. This can result in bone fractures. If you are 65 years old or older, or if you are at risk for osteoporosis and fractures, ask your health care provider if you should: Be screened for bone loss. Take a calcium or vitamin D supplement to lower your risk of fractures. Be given hormone replacement therapy (HRT) to treat symptoms of menopause. Follow these instructions at home: Lifestyle Do not use any products that contain nicotine or tobacco, such as cigarettes, e-cigarettes, and chewing tobacco. If you need help quitting, ask your health care provider. Do not use street drugs. Do not share needles. Ask your health care provider for help if you need support or information about quitting drugs. Alcohol use Do not drink alcohol if: Your health care provider tells you not to drink. You are pregnant, may be pregnant, or are planning to become pregnant. If you drink alcohol: Limit how much you use to 0-1 drink a day. Limit intake if you are breastfeeding. Be aware of how much alcohol is in your drink. In the [place] one drink equals one 12 oz bottle of beer (355 mL), one 5 oz glass of wine (148 mL), or one 1 oz glass of hard liquor (44 mL). General instructions Schedule regular health, dental, and eye exams. Stay current with your vaccines. Tell your health care provider if: You often feel depressed. You have ever been abused or do not feel safe at home. Summary Adopting a healthy lifestyle and getting preventive care are important in promoting health and wellness. Follow your health care provider's instructions about healthy diet, exercising, and getting tested or screened for diseases. Follow your health care provider's  instructions on monitoring your cholesterol and blood pressure. This information is not intended to replace advice given to you by your health care provider. Make sure you discuss any questions you have with your healthcare provider. Document Revised: 10/07/2018 Document Reviewed: 10/07/2018 Elsevier Patient Education  2022 Elsevier Inc.  

## 2021-06-11 NOTE — Progress Notes (Signed)
I,Yamilka Roman [employer] as a Education administrator for [employer], FNP.,have documented all relevant documentation on the behalf of Minette Brine, FNP,as directed by  Minette Brine, FNP while in the presence of Minette Brine, [place].   This visit occurred during the SARS-CoV-2 public health emergency.  Safety protocols were in place, including screening questions prior to the visit, additional usage of staff PPE, and extensive cleaning of exam room while observing appropriate contact time as indicated for disinfecting solutions.  Subjective:     Patient ID: Julie Zamora , female    DOB: [birth year] , 70 y.o.   MRN: [id number]   Chief Complaint  Patient presents with   Annual Exam    HPI  Patient here for hm'  Wt Readings from Last 3 Encounters: 06/11/21 : 218 lb 9.6 oz (99.2 kg) 12/05/20 : 221 lb 3.2 oz (100.3 kg) 06/05/20 : 224 lb (101.6 kg)   She is to see her dermatologist tomorrow for rash to lef arm after the hot weather.     Past Medical History:  Diagnosis Date   Hypothyroidism      Family History  Problem Relation Age of Onset   Cancer Mother    Diabetes Father    Cancer Cousin    Cancer Cousin      Current Outpatient Medications:    levothyroxine (SYNTHROID) 88 MCG tablet, TAKE 1 TABLET(88 MCG) BY MOUTH DAILY, Disp: 30 tablet, Rfl: 2   Vitamin D, Ergocalciferol, (DRISDOL) 1.25 MG (50000 UNIT) CAPS capsule, TAKE 1 CAPSULE BY MOUTH 2 TIMES A WEEK, Disp: 25 capsule, Rfl: 3   No Known Allergies    The patient states she is post menopausal status. No LMP recorded. Patient is postmenopausal.. Negative for Dysmenorrhea and Negative for Menorrhagia. None since the last episode she seen Dr. Charlesetta Garibaldi and given a medication to stop the bleeding. Negative for: breast discharge, breast lump(s), breast pain and breast self exam. Associated symptoms include abnormal vaginal bleeding. Pertinent negatives include abnormal bleeding (hematology), anxiety, decreased libido, depression,  difficulty falling sleep, dyspareunia, history of infertility, nocturia, sexual dysfunction, sleep disturbances, urinary incontinence, urinary urgency, vaginal discharge and vaginal itching. Diet regular.The patient states her exercise level is minimal.  The patient's tobacco use is:  Social History   Tobacco Use  Smoking Status Never  Smokeless Tobacco Never   She has been exposed to passive smoke. The patient's alcohol use is:  Social History   Substance and Sexual Activity  Alcohol Use Never    Review of Systems  Constitutional: Negative.   HENT: Negative.    Eyes: Negative.   Respiratory: Negative.    Cardiovascular: Negative.   Gastrointestinal: Negative.   Endocrine: Negative.   Genitourinary: Negative.   Musculoskeletal: Negative.   Skin: Negative.   Allergic/Immunologic: Negative.   Neurological: Negative.   Hematological: Negative.   Psychiatric/Behavioral: Negative.      Today's Vitals   06/11/21 0928  BP: 128/78  Pulse: 64  Temp: 97.7 F (36.5 C)  Weight: 218 lb 9.6 oz (99.2 kg)  Height: 5' 6.8" (1.697 m)  PainSc: 0-No pain   Body mass index is 34.44 kg/m.   Objective:  Physical Exam Vitals reviewed.  Constitutional:      General: She is not in acute distress.    Appearance: Normal appearance. She is well-developed.  HENT:     Head: Normocephalic and atraumatic.     Right Ear: Hearing, tympanic membrane, ear canal and external ear normal. There is no impacted cerumen.  Left Ear: Hearing, tympanic membrane, ear canal and external ear normal. There is no impacted cerumen.     Nose:     Comments: Deferred - masked    Mouth/Throat:     Comments: Deferred - masked Eyes:     General: Lids are normal.     Extraocular Movements: Extraocular movements intact.     Conjunctiva/sclera: Conjunctivae normal.     Pupils: Pupils are equal, round, and reactive to light.     Funduscopic exam:    Right eye: No papilledema.        Left eye: No papilledema.   Neck:     Thyroid: No thyroid mass.     Vascular: No carotid bruit.  Cardiovascular:     Rate and Rhythm: Normal rate and regular rhythm.     Pulses: Normal pulses.     Heart sounds: Normal heart sounds. No murmur heard. Pulmonary:     Effort: Pulmonary effort is normal. No respiratory distress.     Breath sounds: Normal breath sounds. No wheezing.  Abdominal:     General: Abdomen is flat. Bowel sounds are normal.     Palpations: Abdomen is soft.  Musculoskeletal:        General: No swelling or tenderness. Normal range of motion.     Cervical back: Full passive range of motion without pain, normal range of motion and neck supple.     Right lower leg: No edema.     Left lower leg: No edema.  Skin:    General: Skin is warm and dry.     Capillary Refill: Capillary refill takes less than 2 seconds.     Findings: Rash (blotchy rash to left upper arm) present.  Neurological:     General: No focal deficit present.     Mental Status: She is alert and oriented to person, place, and time.     Cranial Nerves: No cranial nerve deficit.     Sensory: No sensory deficit.  Psychiatric:        Mood and Affect: Mood normal.        Behavior: Behavior normal.        Thought Content: Thought content normal.        Judgment: Judgment normal.        Assessment And Plan:     1. Encounter for general adult medical examination w/o abnormal findings Behavior modifications discussed and diet history reviewed.   Pt will continue to exercise regularly and modify diet with low GI, plant based foods and decrease intake of processed foods.  Recommend intake of daily multivitamin, Vitamin D, and calcium.  Recommend mammogram and colonoscopy for preventive screenings, as well as recommend immunizations that include influenza, TDAP, and Shingles - CBC  2. Class 1 obesity due to excess calories with body mass index (BMI) of 34.0 to 34.9 in adult, unspecified whether serious comorbidity present She is  encouraged to strive for BMI less than 30 to decrease cardiac risk. Advised to aim for at least 150 minutes of exercise per week.  3. Acquired hypothyroidism Chronic, controlled Continue with current medications, tolerating well. Will make changes pending labs - TSH - T3, free - T4  4. Mixed hyperlipidemia Chronic, controlled Continue with current medications, tolerating statin well - CMP14+EGFR - Lipid panel  5. Vitamin D deficiency Will check vitamin D level and supplement as needed.    Also encouraged to spend 15 minutes in the sun daily.  - VITAMIN D 25 Hydroxy (Vit-D Deficiency, Fractures)  6. Abnormal glucose Encouraged to increase physical activity and eat healthy diet - Hemoglobin A1c  7. Immunization due - Zoster Vaccine Adjuvanted [facility]) injection; Inject 0.5 mLs into the muscle once for 1 dose.  Dispense: 0.5 mL; Refill: 0 - Pneumococcal conjugate vaccine 20-valent (Prevnar-20)  8. Rash and nonspecific skin eruption Blotchy rash to left arm, improving    Patient was given opportunity to ask questions. Patient verbalized understanding of the plan and was able to repeat key elements of the plan. All questions were answered to their satisfaction.   Minette Brine, FNP   I, Minette Brine, FNP, have reviewed all documentation for this visit. The documentation on 06/13/21 for the exam, diagnosis, procedures, and orders are all accurate and complete.  THE PATIENT IS ENCOURAGED TO PRACTICE SOCIAL DISTANCING DUE TO THE COVID-19 PANDEMIC.

## 2021-06-12 LAB — CMP14+EGFR
ALT: 10 IU/L (ref 0–32)
AST: 14 IU/L (ref 0–40)
Albumin/Globulin Ratio: 1.4 (ref 1.2–2.2)
Albumin: 4.2 g/dL (ref 3.8–4.8)
Alkaline Phosphatase: 70 IU/L (ref 44–121)
BUN/Creatinine Ratio: 13 (ref 12–28)
BUN: 13 mg/dL (ref 8–27)
Bilirubin Total: 0.3 mg/dL (ref 0.0–1.2)
CO2: 26 mmol/L (ref 20–29)
Calcium: 9.2 mg/dL (ref 8.7–10.3)
Chloride: 103 mmol/L (ref 96–106)
Creatinine, Ser: 1.04 mg/dL — ABNORMAL HIGH (ref 0.57–1.00)
Globulin, Total: 3.1 g/dL (ref 1.5–4.5)
Glucose: 74 mg/dL (ref 65–99)
Potassium: 4.6 mmol/L (ref 3.5–5.2)
Sodium: 142 mmol/L (ref 134–144)
Total Protein: 7.3 g/dL (ref 6.0–8.5)
eGFR: 59 mL/min/{1.73_m2} — ABNORMAL LOW (ref >59)

## 2021-06-12 LAB — CBC
Hematocrit: 34.9 % (ref 34.0–46.6)
Hemoglobin: 11.4 g/dL (ref 11.1–15.9)
MCH: 28.3 pg (ref 26.6–33.0)
MCHC: 32.7 g/dL (ref 31.5–35.7)
MCV: 87 fL (ref 79–97)
Platelets: 201 10*3/uL (ref 150–450)
RBC: 4.03 x10E6/uL (ref 3.77–5.28)
RDW: 16.4 % — ABNORMAL HIGH (ref 11.7–15.4)
WBC: 4.2 10*3/uL (ref 3.4–10.8)

## 2021-06-12 LAB — T3, FREE: T3, Free: 2 pg/mL (ref 2.0–4.4)

## 2021-06-12 LAB — VITAMIN D 25 HYDROXY (VIT D DEFICIENCY, FRACTURES): Vit D, 25-Hydroxy: 52.2 ng/mL (ref 30.0–100.0)

## 2021-06-12 LAB — TSH: TSH: 3.71 u[IU]/mL (ref 0.450–4.500)

## 2021-06-12 LAB — T4: T4, Total: 9.8 ug/dL (ref 4.5–12.0)

## 2021-06-12 LAB — HEMOGLOBIN A1C
Est. average glucose Bld gHb Est-mCnc: 120 mg/dL
Hgb A1c MFr Bld: 5.8 % — ABNORMAL HIGH (ref 4.8–5.6)

## 2021-06-13 MED ORDER — PREVNAR 20 0.5 ML IM SUSY: 0.5000 mL | PREFILLED_SYRINGE | INTRAMUSCULAR | 0 refills | Status: AC

## 2021-06-15 LAB — LIPID PANEL
Chol/HDL Ratio: 4.1 ratio (ref 0.0–4.4)
Cholesterol, Total: 203 mg/dL — ABNORMAL HIGH (ref 100–199)
HDL: 50 mg/dL (ref >39)
LDL Chol Calc (NIH): 134 mg/dL — ABNORMAL HIGH (ref 0–99)
Triglycerides: 103 mg/dL (ref 0–149)
VLDL Cholesterol Cal: 19 mg/dL (ref 5–40)

## 2021-06-15 LAB — SPECIMEN STATUS REPORT

## 2021-12-04 ENCOUNTER — Other Ambulatory Visit: Payer: Self-pay | Admitting: Internal Medicine

## 2021-12-04 DIAGNOSIS — E039 Hypothyroidism, unspecified: Secondary | ICD-10-CM

## 2021-12-17 NOTE — Progress Notes (Deleted)
°  I,Tomeca Helm T Jasime Westergren,acting as a Neurosurgeon for Arnette Felts, FNP.,have documented all relevant documentation on the behalf of Arnette Felts, FNP,as directed by  Arnette Felts, FNP while in the presence of Arnette Felts, FNP.  This visit occurred during the SARS-CoV-2 public health emergency.  Safety protocols were in place, including screening questions prior to the visit, additional usage of staff PPE, and extensive cleaning of exam room while observing appropriate contact time as indicated for disinfecting solutions.  Subjective:     Patient ID: Julie Zamora , female    DOB: Jul 25, 1951 , 71 y.o.   MRN: [id number]   No chief complaint on file.   HPI  Pt presents today for 6 month f/u.    Past Medical History:  Diagnosis Date   Hypothyroidism      Family History  Problem Relation Age of Onset   Cancer Mother    Diabetes Father    Cancer Cousin    Cancer Cousin      Current Outpatient Medications:    levothyroxine (SYNTHROID) 88 MCG tablet, TAKE 1 TABLET(88 MCG) BY MOUTH DAILY, Disp: 30 tablet, Rfl: 2   Vitamin D, Ergocalciferol, (DRISDOL) 1.25 MG (50000 UNIT) CAPS capsule, TAKE 1 CAPSULE BY MOUTH 2 TIMES A WEEK, Disp: 25 capsule, Rfl: 3   No Known Allergies   Review of Systems  Constitutional: Negative.   Respiratory: Negative.    Cardiovascular: Negative.   Neurological: Negative.   Psychiatric/Behavioral: Negative.      There were no vitals filed for this visit. There is no height or weight on file to calculate BMI.   Objective:  Physical Exam      Assessment And Plan:     1. Class 1 obesity due to excess calories with body mass index (BMI) of 34.0 to 34.9 in adult, unspecified whether serious comorbidity present  2. Acquired hypothyroidism  3. Mixed hyperlipidemia  4. Vitamin D deficiency     Patient was given opportunity to ask questions. Patient verbalized understanding of the plan and was able to repeat key elements of the plan. All questions were  answered to their satisfaction.  Coolidge Breeze, CMA   I, Coolidge Breeze, CMA, have reviewed all documentation for this visit. The documentation on 12/17/21 for the exam, diagnosis, procedures, and orders are all accurate and complete.   IF YOU HAVE BEEN REFERRED TO A SPECIALIST, IT MAY TAKE 1-2 WEEKS TO SCHEDULE/PROCESS THE REFERRAL. IF YOU HAVE NOT HEARD FROM US/SPECIALIST IN TWO WEEKS, PLEASE GIVE [place] A CALL AT [phone].   THE PATIENT IS ENCOURAGED TO PRACTICE SOCIAL DISTANCING DUE TO THE COVID-19 PANDEMIC.

## 2021-12-18 ENCOUNTER — Ambulatory Visit: Payer: BC Managed Care – PPO | Admitting: Nurse Practitioner

## 2021-12-18 DIAGNOSIS — E039 Hypothyroidism, unspecified: Secondary | ICD-10-CM

## 2021-12-18 DIAGNOSIS — E559 Vitamin D deficiency, unspecified: Secondary | ICD-10-CM

## 2021-12-18 DIAGNOSIS — E6609 Other obesity due to excess calories: Secondary | ICD-10-CM

## 2021-12-18 DIAGNOSIS — E782 Mixed hyperlipidemia: Secondary | ICD-10-CM

## 2022-03-07 ENCOUNTER — Other Ambulatory Visit: Payer: Self-pay

## 2022-03-07 DIAGNOSIS — E559 Vitamin D deficiency, unspecified: Secondary | ICD-10-CM

## 2022-03-07 MED ORDER — VITAMIN D (ERGOCALCIFEROL) 1.25 MG (50000 UNIT) PO CAPS: ORAL_CAPSULE | ORAL | 3 refills | Status: DC

## 2022-03-08 ENCOUNTER — Other Ambulatory Visit: Payer: Self-pay

## 2022-03-08 DIAGNOSIS — E559 Vitamin D deficiency, unspecified: Secondary | ICD-10-CM

## 2022-03-08 MED ORDER — VITAMIN D (ERGOCALCIFEROL) 1.25 MG (50000 UNIT) PO CAPS: ORAL_CAPSULE | ORAL | 3 refills | Status: DC

## 2022-03-12 ENCOUNTER — Other Ambulatory Visit: Payer: Self-pay

## 2022-03-12 DIAGNOSIS — E559 Vitamin D deficiency, unspecified: Secondary | ICD-10-CM

## 2022-03-12 MED ORDER — VITAMIN D (ERGOCALCIFEROL) 1.25 MG (50000 UNIT) PO CAPS: ORAL_CAPSULE | ORAL | 3 refills | Status: DC

## 2022-04-24 ENCOUNTER — Other Ambulatory Visit: Payer: Self-pay | Admitting: Nurse Practitioner

## 2022-04-24 DIAGNOSIS — E039 Hypothyroidism, unspecified: Secondary | ICD-10-CM

## 2022-04-24 MED ORDER — LEVOTHYROXINE SODIUM 88 MCG PO TABS: ORAL_TABLET | ORAL | 2 refills | Status: DC

## 2022-04-25 ENCOUNTER — Encounter: Payer: Self-pay | Admitting: Nurse Practitioner

## 2022-04-25 ENCOUNTER — Ambulatory Visit: Payer: BC Managed Care – PPO | Admitting: Nurse Practitioner

## 2022-04-25 VITALS — BP 124/64 | HR 85 | Temp 97.9°F | Ht 66.0 in | Wt 215.4 lb

## 2022-04-25 DIAGNOSIS — Z6834 Body mass index (BMI) 34.0-34.9, adult: Secondary | ICD-10-CM

## 2022-04-25 DIAGNOSIS — E782 Mixed hyperlipidemia: Secondary | ICD-10-CM | POA: Diagnosis not present

## 2022-04-25 DIAGNOSIS — E6609 Other obesity due to excess calories: Secondary | ICD-10-CM

## 2022-04-25 DIAGNOSIS — E559 Vitamin D deficiency, unspecified: Secondary | ICD-10-CM

## 2022-04-25 DIAGNOSIS — R7309 Other abnormal glucose: Secondary | ICD-10-CM | POA: Diagnosis not present

## 2022-04-25 DIAGNOSIS — E039 Hypothyroidism, unspecified: Secondary | ICD-10-CM

## 2022-04-25 NOTE — Progress Notes (Signed)
This visit occurred during the SARS-CoV-2 public health emergency.  Safety protocols were in place, including screening questions prior to the visit, additional usage of staff PPE, and extensive cleaning of exam room while observing appropriate contact time as indicated for disinfecting solutions.  Subjective:     Patient ID: Julie Zamora , female    DOB: 07/12/1951 , 71 y.o.   MRN: [id number]   Chief Complaint  Patient presents with   Hypothyroidism    HPI  Patient presents today for a Hypothyroidism follow up. Patient also wants to go over medication from her dermatologist, she had been having itching all over. She has also been using a cream for her irritation underneath breast. Patient also wants her right calf looked at, It is a black spot she states it was sore to the touch and bigger for about a month, spot is no longer sore  Wt Readings from Last 3 Encounters: 04/25/22 : 215 lb 6.4 oz (97.7 kg) 06/11/21 : 218 lb 9.6 oz (99.2 kg) 12/05/20 : 221 lb 3.2 oz (100.3 kg)       Past Medical History:  Diagnosis Date   Hypothyroidism      Family History  Problem Relation Age of Onset   Cancer Mother    Diabetes Father    Cancer Cousin    Cancer Cousin      Current Outpatient Medications:    clotrimazole-betamethasone (LOTRISONE) cream, SMARTSIG:Topical Morning-Evening, Disp: , Rfl:    Estradiol-Norethindrone Acet (MIMVEY LO) 0.5-0.1 MG tablet, APP 1 ML EXT TO THE SCALP D, Disp: , Rfl:    hydrocortisone 2.5 % cream, hydrocortisone 2.5 % topical cream  APP EXT AA BID, Disp: , Rfl:    ketoconazole (NIZORAL) 2 % cream, Apply topically., Disp: , Rfl:    levothyroxine (SYNTHROID) 88 MCG tablet, TAKE 1 TABLET(88 MCG) BY MOUTH DAILY, Disp: 30 tablet, Rfl: 2   Vitamin D, Ergocalciferol, (DRISDOL) 1.25 MG (50000 UNIT) CAPS capsule, TAKE 1 CAPSULE BY MOUTH 2 TIMES A WEEK Strength: 1.25mg , Disp: 24 capsule, Rfl: 1   No Known Allergies   Review of Systems  Constitutional:  Negative.   Respiratory: Negative.    Cardiovascular: Negative.   Skin:  Positive for rash.       Right inner calf with darkened area and semi soft. She has darkened areas to arms and legs - treated by dermatology  Psychiatric/Behavioral: Negative.       Today's Vitals   04/25/22 1055  BP: 124/64  Pulse: 85  Temp: 97.9 F (36.6 C)  Weight: 215 lb 6.4 oz (97.7 kg)  Height: 5\' 6"  (1.676 m)   Body mass index is 34.77 kg/m.   Objective:  Physical Exam Vitals reviewed.  Constitutional:      General: She is not in acute distress.    Appearance: Normal appearance. She is well-developed. She is obese.  HENT:     Head: Normocephalic and atraumatic.  Eyes:     Pupils: Pupils are equal, round, and reactive to light.  Cardiovascular:     Rate and Rhythm: Normal rate and regular rhythm.     Pulses: Normal pulses.     Heart sounds: Normal heart sounds. No murmur heard. Pulmonary:     Effort: Pulmonary effort is normal. No respiratory distress.     Breath sounds: Normal breath sounds. No wheezing.  Musculoskeletal:        General: Normal range of motion.  Skin:    General: Skin is warm and dry.  Capillary Refill: Capillary refill takes less than 2 seconds.  Neurological:     General: No focal deficit present.     Mental Status: She is alert and oriented to person, place, and time.     Cranial Nerves: No cranial nerve deficit.     Motor: No weakness.  Psychiatric:        Mood and Affect: Mood normal.        Behavior: Behavior normal.        Thought Content: Thought content normal.        Judgment: Judgment normal.         Assessment And Plan:     1. Acquired hypothyroidism Comments: Stable, continue current medications.  - TSH - T4 - T3, free  2. Mixed hyperlipidemia Comments: Stable, tolerating statin.  - Lipid panel  3. Abnormal glucose Comments: Stable and diet controlled, continue focusing on weight loss. - Hemoglobin A1c  4. Vitamin D deficiency Will  check vitamin D level and supplement as needed.    Also encouraged to spend 15 minutes in the sun daily.  - VITAMIN D 25 Hydroxy (Vit-D Deficiency, Fractures)  5. Class 1 obesity due to excess calories with body mass index (BMI) of 34.0 to 34.9 in adult, unspecified whether serious comorbidity present She is encouraged to strive for BMI less than 30 to decrease cardiac risk. Advised to aim for at least 150 minutes of exercise per week.    Patient was given opportunity to ask questions. Patient verbalized understanding of the plan and was able to repeat key elements of the plan. All questions were answered to their satisfaction.  Arnette Felts, FNP   I, Arnette Felts, FNP, have reviewed all documentation for this visit. The documentation on 04/25/22 for the exam, diagnosis, procedures, and orders are all accurate and complete.   IF YOU HAVE BEEN REFERRED TO A SPECIALIST, IT MAY TAKE 1-2 WEEKS TO SCHEDULE/PROCESS THE REFERRAL. IF YOU HAVE NOT HEARD FROM US/SPECIALIST IN TWO WEEKS, PLEASE GIVE [place] A CALL AT [phone] X [phone].   THE PATIENT IS ENCOURAGED TO PRACTICE SOCIAL DISTANCING DUE TO THE COVID-19 PANDEMIC.

## 2022-04-26 LAB — VITAMIN D 25 HYDROXY (VIT D DEFICIENCY, FRACTURES): Vit D, 25-Hydroxy: 56.2 ng/mL (ref 30.0–100.0)

## 2022-04-26 LAB — TSH: TSH: 5.69 u[IU]/mL — ABNORMAL HIGH (ref 0.450–4.500)

## 2022-04-26 LAB — LIPID PANEL
Chol/HDL Ratio: 3.7 ratio (ref 0.0–4.4)
Cholesterol, Total: 203 mg/dL — ABNORMAL HIGH (ref 100–199)
HDL: 55 mg/dL (ref >39)
LDL Chol Calc (NIH): 132 mg/dL — ABNORMAL HIGH (ref 0–99)
Triglycerides: 88 mg/dL (ref 0–149)
VLDL Cholesterol Cal: 16 mg/dL (ref 5–40)

## 2022-04-26 LAB — T4: T4, Total: 9.7 ug/dL (ref 4.5–12.0)

## 2022-04-26 LAB — HEMOGLOBIN A1C
Est. average glucose Bld gHb Est-mCnc: 117 mg/dL
Hgb A1c MFr Bld: 5.7 % — ABNORMAL HIGH (ref 4.8–5.6)

## 2022-04-26 LAB — T3, FREE: T3, Free: 2.4 pg/mL (ref 2.0–4.4)

## 2022-05-06 ENCOUNTER — Other Ambulatory Visit: Payer: Self-pay

## 2022-05-06 DIAGNOSIS — E559 Vitamin D deficiency, unspecified: Secondary | ICD-10-CM

## 2022-05-06 MED ORDER — VITAMIN D (ERGOCALCIFEROL) 1.25 MG (50000 UNIT) PO CAPS: ORAL_CAPSULE | ORAL | 0 refills | Status: DC

## 2022-05-10 ENCOUNTER — Other Ambulatory Visit: Payer: Self-pay

## 2022-05-10 ENCOUNTER — Other Ambulatory Visit: Payer: Self-pay | Admitting: Nurse Practitioner

## 2022-05-10 DIAGNOSIS — E559 Vitamin D deficiency, unspecified: Secondary | ICD-10-CM

## 2022-05-10 MED ORDER — VITAMIN D (ERGOCALCIFEROL) 1.25 MG (50000 UNIT) PO CAPS: ORAL_CAPSULE | ORAL | 1 refills | Status: DC

## 2022-06-18 ENCOUNTER — Encounter: Payer: Self-pay | Admitting: Nurse Practitioner

## 2022-06-18 ENCOUNTER — Ambulatory Visit (INDEPENDENT_AMBULATORY_CARE_PROVIDER_SITE_OTHER): Payer: BC Managed Care – PPO | Admitting: Nurse Practitioner

## 2022-06-18 VITALS — BP 120/68 | HR 89 | Temp 97.8°F | Ht 66.0 in | Wt 216.6 lb

## 2022-06-18 DIAGNOSIS — Z6834 Body mass index (BMI) 34.0-34.9, adult: Secondary | ICD-10-CM

## 2022-06-18 DIAGNOSIS — E782 Mixed hyperlipidemia: Secondary | ICD-10-CM

## 2022-06-18 DIAGNOSIS — E039 Hypothyroidism, unspecified: Secondary | ICD-10-CM

## 2022-06-18 DIAGNOSIS — E559 Vitamin D deficiency, unspecified: Secondary | ICD-10-CM

## 2022-06-18 DIAGNOSIS — Z0000 Encounter for general adult medical examination without abnormal findings: Secondary | ICD-10-CM

## 2022-06-18 DIAGNOSIS — E6609 Other obesity due to excess calories: Secondary | ICD-10-CM

## 2022-06-18 DIAGNOSIS — H6121 Impacted cerumen, right ear: Secondary | ICD-10-CM | POA: Diagnosis not present

## 2022-06-18 DIAGNOSIS — Z79899 Other long term (current) drug therapy: Secondary | ICD-10-CM

## 2022-06-18 DIAGNOSIS — Z23 Encounter for immunization: Secondary | ICD-10-CM

## 2022-06-18 DIAGNOSIS — R7309 Other abnormal glucose: Secondary | ICD-10-CM

## 2022-06-18 NOTE — Patient Instructions (Signed)

## 2022-06-18 NOTE — Progress Notes (Unsigned)
Barnet Glasgow Martin,acting as a Education administrator for Minette Brine, FNP.,have documented all relevant documentation on the behalf of Minette Brine, FNP,as directed by  Minette Brine, FNP while in the presence of Minette Brine, Vickery.   Subjective:     Patient ID: Julie Zamora , female    DOB: 1951-03-07 , 71 y.o.   MRN: [id number]   Chief Complaint  Patient presents with   Annual Exam    HPI  Patient presents today for HM, Patient states compliance with meds. Patient has no other issues today.   Wt Readings from Last 3 Encounters: 06/18/22 : 216 lb 9.6 oz (98.2 kg) 04/25/22 : 215 lb 6.4 oz (97.7 kg) 06/11/21 : 218 lb 9.6 oz (99.2 kg)    BP Readings from Last 3 Encounters: 06/18/22 : 120/68 04/25/22 : 124/64 06/11/21 : 128/78      Past Medical History:  Diagnosis Date   Hypothyroidism      Family History  Problem Relation Age of Onset   Cancer Mother    Diabetes Father    Cancer Cousin    Cancer Cousin      Current Outpatient Medications:    clotrimazole-betamethasone (LOTRISONE) cream, SMARTSIG:Topical Morning-Evening, Disp: , Rfl:    Estradiol-Norethindrone Acet (MIMVEY LO) 0.5-0.1 MG tablet, APP 1 ML EXT TO THE SCALP D, Disp: , Rfl:    hydrocortisone 2.5 % cream, hydrocortisone 2.5 % topical cream  APP EXT AA BID, Disp: , Rfl:    ketoconazole (NIZORAL) 2 % cream, Apply topically., Disp: , Rfl:    levothyroxine (SYNTHROID) 88 MCG tablet, TAKE 1 TABLET(88 MCG) BY MOUTH DAILY, Disp: 30 tablet, Rfl: 2   Vitamin D, Ergocalciferol, (DRISDOL) 1.25 MG (50000 UNIT) CAPS capsule, TAKE 1 CAPSULE BY MOUTH 2 TIMES A WEEK Strength: 1.49m, Disp: 24 capsule, Rfl: 1   No Known Allergies    The patient states she is post menopausal status.  Negative for: breast discharge, breast lump(s), breast pain and breast self exam. Associated symptoms include abnormal vaginal bleeding. Pertinent negatives include abnormal bleeding (hematology), anxiety, decreased libido, depression, difficulty falling  sleep, dyspareunia, history of infertility, nocturia, sexual dysfunction, sleep disturbances, urinary incontinence, urinary urgency, vaginal discharge and vaginal itching. Diet regular; tries to cut back on eggs and had been putting cheese on her eggs but has cut back. She is trying to avoid juices.  The patient states her exercise level is moderate  The patient's tobacco use is:  Social History   Tobacco Use  Smoking Status Never  Smokeless Tobacco Never   She has been exposed to passive smoke. The patient's alcohol use is:  Social History   Substance and Sexual Activity  Alcohol Use Never    Review of Systems  Constitutional: Negative.   HENT: Negative.    Eyes: Negative.   Respiratory: Negative.    Cardiovascular: Negative.   Gastrointestinal: Negative.   Endocrine: Negative.   Genitourinary: Negative.   Musculoskeletal: Negative.   Skin: Negative.   Allergic/Immunologic: Negative.   Neurological: Negative.   Hematological: Negative.   Psychiatric/Behavioral: Negative.       Today's Vitals   06/18/22 0952  BP: 120/68  Pulse: 89  Temp: 97.8 F (36.6 C)  TempSrc: Oral  Weight: 216 lb 9.6 oz (98.2 kg)  Height: 5' 6" (1.676 m)  PainSc: 0-No pain   Body mass index is 34.96 kg/m.  Wt Readings from Last 3 Encounters:  06/18/22 216 lb 9.6 oz (98.2 kg)  04/25/22 215 lb 6.4 oz (97.7 kg)  06/11/21 218 lb 9.6 oz (99.2 kg)     Objective:  Physical Exam Vitals reviewed.  Constitutional:      General: She is not in acute distress.    Appearance: Normal appearance. She is well-developed.  HENT:     Head: Normocephalic and atraumatic.     Right Ear: Hearing and external ear normal. There is impacted cerumen.     Left Ear: Hearing, tympanic membrane, ear canal and external ear normal. There is no impacted cerumen.     Nose:     Comments: Deferred - masked    Mouth/Throat:     Comments: Deferred - masked Eyes:     General: Lids are normal.     Extraocular  Movements: Extraocular movements intact.     Conjunctiva/sclera: Conjunctivae normal.     Pupils: Pupils are equal, round, and reactive to light.     Funduscopic exam:    Right eye: No papilledema.        Left eye: No papilledema.  Neck:     Thyroid: No thyroid mass.     Vascular: No carotid bruit.  Cardiovascular:     Rate and Rhythm: Normal rate and regular rhythm.     Pulses: Normal pulses.     Heart sounds: Normal heart sounds. No murmur heard. Pulmonary:     Effort: Pulmonary effort is normal. No respiratory distress.     Breath sounds: Normal breath sounds. No wheezing.  Abdominal:     General: Abdomen is flat. Bowel sounds are normal. There is no distension.     Palpations: Abdomen is soft.     Tenderness: There is no abdominal tenderness.  Musculoskeletal:        General: No swelling or tenderness. Normal range of motion.     Cervical back: Full passive range of motion without pain, normal range of motion and neck supple.     Right lower leg: No edema.     Left lower leg: No edema.  Skin:    General: Skin is warm and dry.     Capillary Refill: Capillary refill takes less than 2 seconds.     Findings: No rash.  Neurological:     General: No focal deficit present.     Mental Status: She is alert and oriented to person, place, and time.     Cranial Nerves: No cranial nerve deficit.     Sensory: No sensory deficit.  Psychiatric:        Mood and Affect: Mood normal.        Behavior: Behavior normal.        Thought Content: Thought content normal.        Judgment: Judgment normal.         Assessment And Plan:     1. Annual physical exam Behavior modifications discussed and diet history reviewed.   Pt will continue to exercise regularly and modify diet with low GI, plant based foods and decrease intake of processed foods.  Recommend intake of daily multivitamin, Vitamin D, and calcium.  Recommend mammogram (requested from Dr. Charlesetta Garibaldi) and colonoscopy for preventive  screenings, as well as recommend immunizations that include influenza, TDAP, and Shingles  2. Class 1 obesity due to excess calories with body mass index (BMI) of 34.0 to 34.9 in adult, unspecified whether serious comorbidity present Chronic Discussed healthy diet and regular exercise options  Encouraged to exercise at least 150 minutes per week with 2 days of strength training as tolerated  3. Vitamin D  deficiency Will check vitamin D level and supplement as needed.    Also encouraged to spend 15 minutes in the sun daily.   4. Acquired hypothyroidism Comments: Well controlled, continue current medications.  - TSH - T4 - T3, free  5. Mixed hyperlipidemia Comments: Stable, diet controlled. Encouraged to eat a low fat diet - CMP14+EGFR - Lipid panel  6. Abnormal glucose Comments: Stable, diet controlled, encouraged to get at least 150 minutes of physical activity a week.  - CMP14+EGFR - Hemoglobin A1c  7. Immunization due - Zoster Recombinant (Shingrix )  8. Impacted cerumen of right ear Comments: Water lavage done with success, encouraged to use debrox drops in future - Ear Lavage  9. Other long term (current) drug therapy - CBC no Diff     Patient was given opportunity to ask questions. Patient verbalized understanding of the plan and was able to repeat key elements of the plan. All questions were answered to their satisfaction.   Minette Brine, FNP   I, Minette Brine, FNP, have reviewed all documentation for this visit. The documentation on 06/18/22 for the exam, diagnosis, procedures, and orders are all accurate and complete.   THE PATIENT IS ENCOURAGED TO PRACTICE SOCIAL DISTANCING DUE TO THE COVID-19 PANDEMIC.

## 2022-06-19 LAB — CMP14+EGFR
ALT: 11 IU/L (ref 0–32)
AST: 18 IU/L (ref 0–40)
Albumin/Globulin Ratio: 1.4 (ref 1.2–2.2)
Albumin: 4.2 g/dL (ref 3.9–4.9)
Alkaline Phosphatase: 79 IU/L (ref 44–121)
BUN/Creatinine Ratio: 11 — ABNORMAL LOW (ref 12–28)
BUN: 10 mg/dL (ref 8–27)
Bilirubin Total: 0.3 mg/dL (ref 0.0–1.2)
CO2: 23 mmol/L (ref 20–29)
Calcium: 9 mg/dL (ref 8.7–10.3)
Chloride: 101 mmol/L (ref 96–106)
Creatinine, Ser: 0.91 mg/dL (ref 0.57–1.00)
Globulin, Total: 3.1 g/dL (ref 1.5–4.5)
Glucose: 69 mg/dL — ABNORMAL LOW (ref 70–99)
Potassium: 4.3 mmol/L (ref 3.5–5.2)
Sodium: 139 mmol/L (ref 134–144)
Total Protein: 7.3 g/dL (ref 6.0–8.5)
eGFR: 69 mL/min/{1.73_m2} (ref >59)

## 2022-06-19 LAB — CBC
Hematocrit: 37.1 % (ref 34.0–46.6)
Hemoglobin: 12.1 g/dL (ref 11.1–15.9)
MCH: 29.7 pg (ref 26.6–33.0)
MCHC: 32.6 g/dL (ref 31.5–35.7)
MCV: 91 fL (ref 79–97)
Platelets: 183 10*3/uL (ref 150–450)
RBC: 4.07 x10E6/uL (ref 3.77–5.28)
RDW: 14.1 % (ref 11.7–15.4)
WBC: 3.6 10*3/uL (ref 3.4–10.8)

## 2022-06-19 LAB — HEMOGLOBIN A1C
Est. average glucose Bld gHb Est-mCnc: 120 mg/dL
Hgb A1c MFr Bld: 5.8 % — ABNORMAL HIGH (ref 4.8–5.6)

## 2022-06-19 LAB — TSH: TSH: 3.57 u[IU]/mL (ref 0.450–4.500)

## 2022-06-19 LAB — LIPID PANEL
Chol/HDL Ratio: 3.8 ratio (ref 0.0–4.4)
Cholesterol, Total: 205 mg/dL — ABNORMAL HIGH (ref 100–199)
HDL: 54 mg/dL (ref >39)
LDL Chol Calc (NIH): 127 mg/dL — ABNORMAL HIGH (ref 0–99)
Triglycerides: 133 mg/dL (ref 0–149)
VLDL Cholesterol Cal: 24 mg/dL (ref 5–40)

## 2022-06-19 LAB — T4: T4, Total: 10 ug/dL (ref 4.5–12.0)

## 2022-06-19 LAB — T3, FREE: T3, Free: 2.3 pg/mL (ref 2.0–4.4)

## 2022-06-24 DIAGNOSIS — Z23 Encounter for immunization: Secondary | ICD-10-CM | POA: Diagnosis not present

## 2022-07-30 ENCOUNTER — Ambulatory Visit: Payer: BC Managed Care – PPO

## 2022-08-01 ENCOUNTER — Ambulatory Visit (INDEPENDENT_AMBULATORY_CARE_PROVIDER_SITE_OTHER): Payer: BC Managed Care – PPO

## 2022-08-01 VITALS — BP 130/78 | HR 98 | Temp 98.2°F

## 2022-08-01 DIAGNOSIS — Z23 Encounter for immunization: Secondary | ICD-10-CM | POA: Diagnosis not present

## 2022-08-01 NOTE — Patient Instructions (Addendum)
Influenza Virus Vaccine injection What is this medication? INFLUENZA VIRUS VACCINE (in floo EN zuh VAHY ruhs vak SEEN) helps to reduce the risk of getting influenza also known as the flu. The vaccine only helps protect you against some strains of the flu. This medicine may be used for other purposes; ask your health care provider or pharmacist if you have questions. COMMON BRAND NAME(S): Afluria, Afluria Quadrivalent, Agriflu, Alfuria, FLUAD, FLUAD Quadrivalent, Fluarix, Fluarix Quadrivalent, Flublok, Flublok Quadrivalent, FLUCELVAX, FLUCELVAX Quadrivalent, Flulaval, Flulaval Quadrivalent, Fluvirin, Fluzone, Fluzone High-Dose, Fluzone Intradermal, Fluzone Quadrivalent What should I tell my care team before I take this medication? They need to know if you have any of these conditions: bleeding disorder like hemophilia fever or infection Guillain-Barre syndrome or other neurological problems immune system problems infection with the human immunodeficiency virus (HIV) or AIDS low blood platelet counts multiple sclerosis an unusual or allergic reaction to influenza virus vaccine, latex, other medicines, foods, dyes, or preservatives. Different brands of vaccines contain different allergens. Some may contain latex or eggs. Talk to your doctor about your allergies to make sure that you get the right vaccine. pregnant or trying to get pregnant breast-feeding How should I use this medication? This vaccine is for injection into a muscle or under the skin. It is given by a health care professional. A copy of Vaccine Information Statements will be given before each vaccination. Read this sheet carefully each time. The sheet may change frequently. Talk to your healthcare provider to see which vaccines are right for you. Some vaccines should not be used in all age groups. Overdosage: If you think you have taken too much of this medicine contact a poison control center or emergency room at once. NOTE: This  medicine is only for you. Do not share this medicine with others. What if I miss a dose? This does not apply. What may interact with this medication? chemotherapy or radiation therapy medicines that lower your immune system like etanercept, anakinra, infliximab, and adalimumab medicines that treat or prevent blood clots like warfarin phenytoin steroid medicines like prednisone or cortisone theophylline vaccines This list may not describe all possible interactions. Give your health care provider a list of all the medicines, herbs, non-prescription drugs, or dietary supplements you use. Also tell them if you smoke, drink alcohol, or use illegal drugs. Some items may interact with your medicine. What should I watch for while using this medication? Report any side effects that do not go away within 3 days to your doctor or health care professional. Call your health care provider if any unusual symptoms occur within 6 weeks of receiving this vaccine. You may still catch the flu, but the illness is not usually as bad. You cannot get the flu from the vaccine. The vaccine will not protect against colds or other illnesses that may cause fever. The vaccine is needed every year. What side effects may I notice from receiving this medication? Side effects that you should report to your doctor or health care professional as soon as possible: allergic reactions like skin rash, itching or hives, swelling of the face, lips, or tongue Side effects that usually do not require medical attention (report to your doctor or health care professional if they continue or are bothersome): fever headache muscle aches and pains pain, tenderness, redness, or swelling at the injection site tiredness This list may not describe all possible side effects. Call your doctor for medical advice about side effects. You may report side effects to FDA at 1-800-FDA-1088.   Where should I keep my medication? The vaccine will be given  by a health care professional in a clinic, pharmacy, doctor's office, or other health care setting. You will not be given vaccine doses to store at home. NOTE: This sheet is a summary. It may not cover all possible information. If you have questions about this medicine, talk to your doctor, pharmacist, or health care provider.  2023 Elsevier/Gold Standard (2021-05-18 00:00:00)  

## 2022-08-01 NOTE — Progress Notes (Signed)
Patient presents today for flu vaccine.  

## 2022-08-29 ENCOUNTER — Other Ambulatory Visit: Payer: Self-pay

## 2022-08-29 DIAGNOSIS — E039 Hypothyroidism, unspecified: Secondary | ICD-10-CM

## 2022-08-29 DIAGNOSIS — E559 Vitamin D deficiency, unspecified: Secondary | ICD-10-CM

## 2022-08-29 MED ORDER — VITAMIN D (ERGOCALCIFEROL) 1.25 MG (50000 UNIT) PO CAPS: ORAL_CAPSULE | ORAL | 1 refills | Status: DC

## 2022-08-29 MED ORDER — LEVOTHYROXINE SODIUM 88 MCG PO TABS: ORAL_TABLET | ORAL | 1 refills | Status: DC

## 2022-12-06 ENCOUNTER — Other Ambulatory Visit: Payer: Self-pay | Admitting: Nurse Practitioner

## 2022-12-06 DIAGNOSIS — E039 Hypothyroidism, unspecified: Secondary | ICD-10-CM

## 2022-12-19 ENCOUNTER — Encounter: Payer: Self-pay | Admitting: Nurse Practitioner

## 2022-12-19 ENCOUNTER — Ambulatory Visit: Payer: BC Managed Care – PPO | Admitting: Nurse Practitioner

## 2022-12-19 VITALS — BP 124/82 | HR 43 | Temp 97.8°F | Ht 69.0 in | Wt 215.0 lb

## 2022-12-19 DIAGNOSIS — R001 Bradycardia, unspecified: Secondary | ICD-10-CM

## 2022-12-19 DIAGNOSIS — Z23 Encounter for immunization: Secondary | ICD-10-CM

## 2022-12-19 DIAGNOSIS — E782 Mixed hyperlipidemia: Secondary | ICD-10-CM | POA: Diagnosis not present

## 2022-12-19 DIAGNOSIS — E039 Hypothyroidism, unspecified: Secondary | ICD-10-CM | POA: Diagnosis not present

## 2022-12-19 DIAGNOSIS — E559 Vitamin D deficiency, unspecified: Secondary | ICD-10-CM | POA: Diagnosis not present

## 2022-12-19 DIAGNOSIS — Z6831 Body mass index (BMI) 31.0-31.9, adult: Secondary | ICD-10-CM

## 2022-12-19 DIAGNOSIS — R7309 Other abnormal glucose: Secondary | ICD-10-CM | POA: Diagnosis not present

## 2022-12-19 DIAGNOSIS — E661 Drug-induced obesity: Secondary | ICD-10-CM | POA: Insufficient documentation

## 2022-12-19 DIAGNOSIS — E6609 Other obesity due to excess calories: Secondary | ICD-10-CM | POA: Insufficient documentation

## 2022-12-19 MED ORDER — SHINGRIX 50 MCG/0.5ML IM SUSR: 0.5000 mL | Freq: Once | INTRAMUSCULAR | 0 refills | Status: AC

## 2022-12-19 NOTE — Progress Notes (Signed)
I,Sheena H Holbrook,acting as a Education administrator for Minette Brine, FNP.,have documented all relevant documentation on the behalf of Minette Brine, FNP,as directed by  Minette Brine, FNP while in the presence of Minette Brine, [place].    Subjective:     Patient ID: Julie Zamora , female    DOB: 1951-07-05 , 72 y.o.   MRN: [id number]   Chief Complaint  Patient presents with   Follow-up    thyroid    HPI  Patient presents today for follow up on thyroid and cholesterol. Patient has no other complaints or concerns.   Wt Readings from Last 3 Encounters: 12/19/22 : 215 lb (97.5 kg) 06/18/22 : 216 lb 9.6 oz (98.2 kg) 04/25/22 : 215 lb 6.4 oz (97.7 kg)       Past Medical History:  Diagnosis Date   Hypothyroidism      Family History  Problem Relation Age of Onset   Cancer Mother    Diabetes Father    Cancer Cousin    Cancer Cousin      Current Outpatient Medications:    clotrimazole-betamethasone (LOTRISONE) cream, SMARTSIG:Topical Morning-Evening, Disp: , Rfl:    Fluocinolone Acetonide Scalp 0.01 % OIL, SMARTSIG:1 Sparingly Topical Daily PRN, Disp: , Rfl:    hydrocortisone 2.5 % cream, hydrocortisone 2.5 % topical cream  APP EXT AA BID, Disp: , Rfl:    ketoconazole (NIZORAL) 2 % cream, Apply topically., Disp: , Rfl:    levothyroxine (SYNTHROID) 88 MCG tablet, TAKE 1 TABLET(88 MCG) BY MOUTH DAILY, Disp: 90 tablet, Rfl: 1   Vitamin D, Ergocalciferol, (DRISDOL) 1.25 MG (50000 UNIT) CAPS capsule, TAKE 1 CAPSULE BY MOUTH 2 TIMES A WEEK Strength: 1.'25mg'$ , Disp: 24 capsule, Rfl: 1   No Known Allergies   Review of Systems  Constitutional: Negative.   Respiratory: Negative.    Cardiovascular: Negative.   Musculoskeletal: Negative.   Skin: Negative.   Psychiatric/Behavioral: Negative.       Today's Vitals   12/19/22 0828  BP: 124/82  Pulse: (!) 43  Temp: 97.8 F (36.6 C)  TempSrc: Oral  SpO2: 98%  Weight: 215 lb (97.5 kg)  Height: '5\' 9"'$  (1.753 m)   Body mass index is 31.75  kg/m.   Objective:  Physical Exam Vitals reviewed.  Constitutional:      General: She is not in acute distress.    Appearance: Normal appearance. She is well-developed. She is obese.  HENT:     Head: Normocephalic and atraumatic.  Eyes:     Pupils: Pupils are equal, round, and reactive to light.  Cardiovascular:     Rate and Rhythm: Normal rate and regular rhythm.     Pulses: Normal pulses.     Heart sounds: Normal heart sounds. No murmur heard. Pulmonary:     Effort: Pulmonary effort is normal. No respiratory distress.     Breath sounds: Normal breath sounds. No wheezing.  Musculoskeletal:        General: Normal range of motion.  Skin:    General: Skin is warm and dry.     Capillary Refill: Capillary refill takes less than 2 seconds.  Neurological:     General: No focal deficit present.     Mental Status: She is alert and oriented to person, place, and time.     Cranial Nerves: No cranial nerve deficit.     Motor: No weakness.  Psychiatric:        Mood and Affect: Mood normal.        Behavior: Behavior  normal.        Thought Content: Thought content normal.        Judgment: Judgment normal.         Assessment And Plan:     1. Acquired hypothyroidism Comments: Thyroid levels are within normal limits at last visit, will recheck today. No refills at this time - TSH + free T4  2. Mixed hyperlipidemia Comments: Cholesterol levels stable, continue diet low in fat - Lipid panel - CMP14 + Anion Gap  3. Vitamin D deficiency Will check vitamin D level and supplement as needed.    Also encouraged to spend 15 minutes in the sun daily.  - VITAMIN D 25 Hydroxy (Vit-D Deficiency, Fractures)  4. Abnormal glucose Comments: Stable, continue current medications. - CMP14 + Anion Gap - Hemoglobin A1c  5. Bradycardia Comments: Apical heart rate is 50, ECG done with no current symptoms. Will refer to Cardiology for evaluation. Lowest heart rate was 43 - EKG 12-Lead -  CBC  6. Class 1 drug-induced obesity without serious comorbidity with body mass index (BMI) of 31.0 to 31.9 in adult She is encouraged to strive for BMI less than 30 to decrease cardiac risk. Advised to aim for at least 150 minutes of exercise per week.  7. Need for zoster vaccine Comments: Rx for second Shingrix sent to pharmacy, rejected by [facility], patient aware - Zoster Vaccine Adjuvanted [facility]) injection; Inject 0.5 mLs into the muscle once for 1 dose.  Dispense: 1 each; Refill: 0     Patient was given opportunity to ask questions. Patient verbalized understanding of the plan and was able to repeat key elements of the plan. All questions were answered to their satisfaction.  Minette Brine, FNP   I, Minette Brine, FNP, have reviewed all documentation for this visit. The documentation on 12/19/22 for the exam, diagnosis, procedures, and orders are all accurate and complete.   IF YOU HAVE BEEN REFERRED TO A SPECIALIST, IT MAY TAKE 1-2 WEEKS TO SCHEDULE/PROCESS THE REFERRAL. IF YOU HAVE NOT HEARD FROM US/SPECIALIST IN TWO WEEKS, PLEASE GIVE [place] A CALL AT [phone] X [phone].   THE PATIENT IS ENCOURAGED TO PRACTICE SOCIAL DISTANCING DUE TO THE COVID-19 PANDEMIC.

## 2022-12-20 LAB — CMP14 + ANION GAP
ALT: 9 IU/L (ref 0–32)
AST: 16 IU/L (ref 0–40)
Albumin/Globulin Ratio: 1.2 (ref 1.2–2.2)
Albumin: 3.9 g/dL (ref 3.9–4.9)
Alkaline Phosphatase: 83 IU/L (ref 44–121)
Anion Gap: 11 mmol/L (ref 10.0–18.0)
BUN/Creatinine Ratio: 13 (ref 12–28)
BUN: 13 mg/dL (ref 8–27)
Bilirubin Total: 0.3 mg/dL (ref 0.0–1.2)
CO2: 26 mmol/L (ref 20–29)
Calcium: 9.1 mg/dL (ref 8.7–10.3)
Chloride: 102 mmol/L (ref 96–106)
Creatinine, Ser: 1.02 mg/dL — ABNORMAL HIGH (ref 0.57–1.00)
Globulin, Total: 3.3 g/dL (ref 1.5–4.5)
Glucose: 73 mg/dL (ref 70–99)
Potassium: 4.1 mmol/L (ref 3.5–5.2)
Sodium: 139 mmol/L (ref 134–144)
Total Protein: 7.2 g/dL (ref 6.0–8.5)
eGFR: 60 mL/min/{1.73_m2} (ref >59)

## 2022-12-20 LAB — HEMOGLOBIN A1C
Est. average glucose Bld gHb Est-mCnc: 123 mg/dL
Hgb A1c MFr Bld: 5.9 % — ABNORMAL HIGH (ref 4.8–5.6)

## 2022-12-20 LAB — CBC
Hematocrit: 36.8 % (ref 34.0–46.6)
Hemoglobin: 12 g/dL (ref 11.1–15.9)
MCH: 29.4 pg (ref 26.6–33.0)
MCHC: 32.6 g/dL (ref 31.5–35.7)
MCV: 90 fL (ref 79–97)
Platelets: 175 10*3/uL (ref 150–450)
RBC: 4.08 x10E6/uL (ref 3.77–5.28)
RDW: 13.9 % (ref 11.7–15.4)
WBC: 3.4 10*3/uL (ref 3.4–10.8)

## 2022-12-20 LAB — TSH+FREE T4
Free T4: 1.38 ng/dL (ref 0.82–1.77)
TSH: 3.66 u[IU]/mL (ref 0.450–4.500)

## 2022-12-20 LAB — LIPID PANEL
Chol/HDL Ratio: 3.5 ratio (ref 0.0–4.4)
Cholesterol, Total: 208 mg/dL — ABNORMAL HIGH (ref 100–199)
HDL: 59 mg/dL (ref >39)
LDL Chol Calc (NIH): 134 mg/dL — ABNORMAL HIGH (ref 0–99)
Triglycerides: 86 mg/dL (ref 0–149)
VLDL Cholesterol Cal: 15 mg/dL (ref 5–40)

## 2022-12-20 LAB — VITAMIN D 25 HYDROXY (VIT D DEFICIENCY, FRACTURES): Vit D, 25-Hydroxy: 55.8 ng/mL (ref 30.0–100.0)

## 2022-12-29 ENCOUNTER — Encounter: Payer: Self-pay | Admitting: Nurse Practitioner

## 2023-03-27 ENCOUNTER — Ambulatory Visit: Payer: Self-pay | Admitting: Family Medicine

## 2023-04-08 ENCOUNTER — Telehealth: Payer: Self-pay

## 2023-04-08 ENCOUNTER — Other Ambulatory Visit (HOSPITAL_BASED_OUTPATIENT_CLINIC_OR_DEPARTMENT_OTHER): Payer: Self-pay | Admitting: Medical

## 2023-04-08 ENCOUNTER — Ambulatory Visit (HOSPITAL_BASED_OUTPATIENT_CLINIC_OR_DEPARTMENT_OTHER)
Admission: RE | Admit: 2023-04-08 | Discharge: 2023-04-08 | Disposition: A | Discharge status: Home / Self Care | Payer: BC Managed Care – PPO | Source: Ambulatory Visit | Attending: Medical | Admitting: Medical

## 2023-04-08 DIAGNOSIS — M79605 Pain in left leg: Secondary | ICD-10-CM | POA: Diagnosis present

## 2023-04-08 DIAGNOSIS — M79604 Pain in right leg: Secondary | ICD-10-CM | POA: Insufficient documentation

## 2023-04-08 NOTE — Telephone Encounter (Signed)
Patient called today and left message about foot pain, patient was encouraged to go to [facility]. Patient stated she would try to get there before they close today.

## 2023-06-24 NOTE — Progress Notes (Unsigned)
Madelaine Bhat, CMA,acting as a Neurosurgeon for Arnette Felts, FNP.,have documented all relevant documentation on the behalf of Arnette Felts, FNP,as directed by  Arnette Felts, FNP while in the presence of Arnette Felts, FNP.  Subjective:    Patient ID: Julie Zamora , female    DOB: 1951-01-25 , 72 y.o.   MRN: [id number]  No chief complaint on file.   HPI  Patient presents today for HM, patient reports compliance with medications. Patient denies any chest pain, SOB, or headaches. Patient has no concerns today.    Past Medical History:  Diagnosis Date  . Hypothyroidism      Family History  Problem Relation Age of Onset  . Cancer Mother   . Diabetes Father   . Cancer Cousin   . Cancer Cousin      Current Outpatient Medications:  .  clotrimazole-betamethasone (LOTRISONE) cream, SMARTSIG:Topical Morning-Evening, Disp: , Rfl:  .  Fluocinolone Acetonide Scalp 0.01 % OIL, SMARTSIG:1 Sparingly Topical Daily PRN, Disp: , Rfl:  .  hydrocortisone 2.5 % cream, hydrocortisone 2.5 % topical cream  APP EXT AA BID, Disp: , Rfl:  .  ketoconazole (NIZORAL) 2 % cream, Apply topically., Disp: , Rfl:  .  levothyroxine (SYNTHROID) 88 MCG tablet, TAKE 1 TABLET(88 MCG) BY MOUTH DAILY, Disp: 90 tablet, Rfl: 1 .  Vitamin D, Ergocalciferol, (DRISDOL) 1.25 MG (50000 UNIT) CAPS capsule, TAKE 1 CAPSULE BY MOUTH 2 TIMES A WEEK Strength: 1.25mg , Disp: 24 capsule, Rfl: 1   No Known Allergies    The patient states she uses {contraceptive methods:5051} for birth control. No LMP recorded. Patient is postmenopausal.. {Dysmenorrhea-menorrhagia:21918}. Negative for: breast discharge, breast lump(s), breast pain and breast self exam. Associated symptoms include abnormal vaginal bleeding. Pertinent negatives include abnormal bleeding (hematology), anxiety, decreased libido, depression, difficulty falling sleep, dyspareunia, history of infertility, nocturia, sexual dysfunction, sleep disturbances, urinary incontinence,  urinary urgency, vaginal discharge and vaginal itching. Diet regular.The patient states her exercise level is    . The patient's tobacco use is:  Social History   Tobacco Use  Smoking Status Never  Smokeless Tobacco Never  . She has been exposed to passive smoke. The patient's alcohol use is:  Social History   Substance and Sexual Activity  Alcohol Use Never  . Additional information: Last pap ***, next one scheduled for ***.    Review of Systems   There were no vitals filed for this visit. There is no height or weight on file to calculate BMI.  Wt Readings from Last 3 Encounters:  12/19/22 215 lb (97.5 kg)  06/18/22 216 lb 9.6 oz (98.2 kg)  04/25/22 215 lb 6.4 oz (97.7 kg)     Objective:  Physical Exam      Assessment And Plan:     Encounter for annual health examination  Acquired hypothyroidism  Mixed hyperlipidemia  Vitamin D deficiency     No follow-ups on file. Patient was given opportunity to ask questions. Patient verbalized understanding of the plan and was able to repeat key elements of the plan. All questions were answered to their satisfaction.   Arnette Felts, FNP  I, Arnette Felts, FNP, have reviewed all documentation for this visit. The documentation on 06/24/23 for the exam, diagnosis, procedures, and orders are all accurate and complete.

## 2023-06-25 ENCOUNTER — Encounter: Payer: BC Managed Care – PPO | Admitting: Nurse Practitioner

## 2023-06-25 DIAGNOSIS — E039 Hypothyroidism, unspecified: Secondary | ICD-10-CM

## 2023-06-25 DIAGNOSIS — E559 Vitamin D deficiency, unspecified: Secondary | ICD-10-CM

## 2023-06-25 DIAGNOSIS — E782 Mixed hyperlipidemia: Secondary | ICD-10-CM

## 2023-06-25 DIAGNOSIS — Z0000 Encounter for general adult medical examination without abnormal findings: Secondary | ICD-10-CM

## 2023-08-11 ENCOUNTER — Other Ambulatory Visit: Payer: Self-pay

## 2023-08-11 DIAGNOSIS — E559 Vitamin D deficiency, unspecified: Secondary | ICD-10-CM

## 2023-08-11 MED ORDER — VITAMIN D (ERGOCALCIFEROL) 1.25 MG (50000 UNIT) PO CAPS: ORAL_CAPSULE | ORAL | 2 refills | Status: DC

## 2023-11-25 NOTE — Progress Notes (Deleted)
 Madelaine Bhat, CMA,acting as a Neurosurgeon for Arnette Felts, FNP.,have documented all relevant documentation on the behalf of Arnette Felts, FNP,as directed by  Arnette Felts, FNP while in the presence of Arnette Felts, FNP.  Subjective:  Patient ID: Julie Zamora , female    DOB: August 18, 1951 , 73 y.o.   MRN: [id number]  No chief complaint on file.   HPI  Patient presents today for vaginal bleeding. Patient reports compliance with medication. Patient denies any chest pain, SOB, or headaches. Patient has no concerns today.     Past Medical History:  Diagnosis Date  . Hypothyroidism      Family History  Problem Relation Age of Onset  . Cancer Mother   . Diabetes Father   . Cancer Cousin   . Cancer Cousin      Current Outpatient Medications:  .  clotrimazole-betamethasone (LOTRISONE) cream, SMARTSIG:Topical Morning-Evening, Disp: , Rfl:  .  Fluocinolone Acetonide Scalp 0.01 % OIL, SMARTSIG:1 Sparingly Topical Daily PRN, Disp: , Rfl:  .  hydrocortisone 2.5 % cream, hydrocortisone 2.5 % topical cream  APP EXT AA BID, Disp: , Rfl:  .  ketoconazole (NIZORAL) 2 % cream, Apply topically., Disp: , Rfl:  .  levothyroxine (SYNTHROID) 88 MCG tablet, TAKE 1 TABLET(88 MCG) BY MOUTH DAILY, Disp: 90 tablet, Rfl: 1 .  Vitamin D, Ergocalciferol, (DRISDOL) 1.25 MG (50000 UNIT) CAPS capsule, TAKE 1 CAPSULE BY MOUTH 2 TIMES A WEEK Strength: 1.25mg , Disp: 24 capsule, Rfl: 2   No Known Allergies   Review of Systems   There were no vitals filed for this visit. There is no height or weight on file to calculate BMI.  Wt Readings from Last 3 Encounters:  12/19/22 215 lb (97.5 kg)  06/18/22 216 lb 9.6 oz (98.2 kg)  04/25/22 215 lb 6.4 oz (97.7 kg)    The 10-year ASCVD risk score (Arnett DK, et al., 2019) is: 11.5%   Values used to calculate the score:     Age: 58 years     Sex: Female     Is Non-Hispanic African American: Yes     Diabetic: No     Tobacco smoker: No     Systolic Blood Pressure: 124  mmHg     Is BP treated: No     HDL Cholesterol: 59 mg/dL     Total Cholesterol: 208 mg/dL  Objective:  Physical Exam      Assessment And Plan:  There are no diagnoses linked to this encounter.  No follow-ups on file.  Patient was given opportunity to ask questions. Patient verbalized understanding of the plan and was able to repeat key elements of the plan. All questions were answered to their satisfaction.    Jeanell Sparrow, FNP, have reviewed all documentation for this visit. The documentation on 11/25/23 for the exam, diagnosis, procedures, and orders are all accurate and complete.   IF YOU HAVE BEEN REFERRED TO A SPECIALIST, IT MAY TAKE 1-2 WEEKS TO SCHEDULE/PROCESS THE REFERRAL. IF YOU HAVE NOT HEARD FROM US/SPECIALIST IN TWO WEEKS, PLEASE GIVE [place] A CALL AT [phone].

## 2023-11-26 ENCOUNTER — Ambulatory Visit: Payer: Medicare Other | Admitting: Nurse Practitioner

## 2023-12-23 ENCOUNTER — Other Ambulatory Visit: Payer: Self-pay

## 2023-12-23 DIAGNOSIS — E039 Hypothyroidism, unspecified: Secondary | ICD-10-CM

## 2023-12-23 MED ORDER — LEVOTHYROXINE SODIUM 88 MCG PO TABS: ORAL_TABLET | ORAL | 1 refills | Status: DC

## 2024-09-22 ENCOUNTER — Other Ambulatory Visit: Payer: Self-pay | Admitting: Nurse Practitioner

## 2024-09-22 DIAGNOSIS — E559 Vitamin D deficiency, unspecified: Secondary | ICD-10-CM

## 2024-10-11 ENCOUNTER — Telehealth: Payer: Self-pay | Admitting: Nurse Practitioner

## 2024-10-11 DIAGNOSIS — E039 Hypothyroidism, unspecified: Secondary | ICD-10-CM

## 2024-10-11 NOTE — Telephone Encounter (Unsigned)
 Copied from CRM #[id number]. Topic: Clinical - Medication Refill >> Oct 11, 2024  5:22 PM China J wrote: Medication: levothyroxine  (SYNTHROID ) 88 MCG tablet  Has the patient contacted their pharmacy? Yes (Agent: If no, request that the patient contact the pharmacy for the refill. If patient does not wish to contact the pharmacy document the reason why and proceed with request.) (Agent: If yes, when and what did the pharmacy advise?)  This is the patient's preferred pharmacy:  [facility] [address] [place] - [address] [place] [address] Phone: [phone] Fax: [phone]  Is this the correct pharmacy for this prescription? Yes If no, delete pharmacy and type the correct one.   Has the prescription been filled recently? No  Is the patient out of the medication? Yes  Has the patient been seen for an appointment in the last year OR does the patient have an upcoming appointment? Yes  Can we respond through MyChart? No  Agent: Please be advised that Rx refills may take up to 3 business days. We ask that you follow-up with your pharmacy.

## 2024-10-12 ENCOUNTER — Telehealth: Payer: Self-pay

## 2024-10-12 NOTE — Telephone Encounter (Unsigned)
 Copied from CRM #[id number]. Topic: Clinical - Prescription Issue >> Oct 12, 2024  3:24 PM Darshell M wrote: Reason for CRM: Patient has not had medicine this week. She takes it M-F. Patient advised of 3 day turn-around but concerned. CB# [id number].

## 2024-10-13 ENCOUNTER — Telehealth: Payer: Self-pay

## 2024-10-13 ENCOUNTER — Other Ambulatory Visit: Payer: Self-pay

## 2024-10-13 DIAGNOSIS — E039 Hypothyroidism, unspecified: Secondary | ICD-10-CM

## 2024-10-13 MED ORDER — LEVOTHYROXINE SODIUM 88 MCG PO TABS: ORAL_TABLET | ORAL | 1 refills | Status: DC

## 2024-10-13 NOTE — Telephone Encounter (Unsigned)
 Copied from CRM #[id number]. Topic: Clinical - Prescription Issue >> Oct 12, 2024  3:24 PM Darshell M wrote: Reason for CRM: Patient has not had medicine this week. She takes it M-F. Patient advised of 3 day turn-around but concerned. CB# [phone]. >> Oct 13, 2024 12:18 PM Donna BRAVO wrote: Patient calling in asking for update on medication refill levothyroxine  (SYNTHROID ) 88 MCG tablet   patient is out of this medication    10/12/24 0646 Reorder Gretta Lauraine HERO, RN To [phone] Patient first called on 10/11/24 informed. Patient that medication refills require 3 business days to be fill.  Patient would like a call back when the prescription has been sent in. [phone]

## 2024-10-18 ENCOUNTER — Other Ambulatory Visit: Payer: Self-pay | Admitting: Nurse Practitioner

## 2024-10-18 DIAGNOSIS — E559 Vitamin D deficiency, unspecified: Secondary | ICD-10-CM

## 2024-10-18 MED ORDER — LEVOTHYROXINE SODIUM 88 MCG PO TABS: ORAL_TABLET | ORAL | 1 refills | Status: AC

## 2024-10-18 NOTE — Telephone Encounter (Unsigned)
 Copied from CRM #[id number]. Topic: Clinical - Medication Refill >> Oct 18, 2024 11:56 AM Willma R wrote: Medication: Vitamin D , Ergocalciferol , (DRISDOL ) 1.25 MG (50000 UNIT) CAPS capsule  Has the patient contacted their pharmacy? Yes, call dr  This is the patient's preferred pharmacy:  [facility] [address], [place] - [address] [place] [address] Phone: [phone] Fax: [phone]  Is this the correct pharmacy for this prescription? Yes If no, delete pharmacy and type the correct one.   Has the prescription been filled recently? No  Is the patient out of the medication? Yes  Has the patient been seen for an appointment in the last year OR does the patient have an upcoming appointment? Yes  Can we respond through MyChart? No  Agent: Please be advised that Rx refills may take up to 3 business days. We ask that you follow-up with your pharmacy.

## 2024-10-19 MED ORDER — VITAMIN D (ERGOCALCIFEROL) 1.25 MG (50000 UNIT) PO CAPS: ORAL_CAPSULE | ORAL | 0 refills | Status: AC

## 2024-11-24 NOTE — Progress Notes (Signed)
 LILLETTE Kristeen JINNY Gladis, CMA,acting as a neurosurgeon for Gaines Ada, FNP.,have documented all relevant documentation on the behalf of Gaines Ada, FNP,as directed by  Gaines Ada, FNP while in the presence of Gaines Ada, FNP.  Subjective:  Patient ID: Julie Zamora , female    DOB: 1951-07-13 , 74 y.o.   MRN: [id number]  Chief Complaint  Patient presents with   Hyperlipidemia    Patient presents today for a CHOL and pre dm follow up, Patient reports compliance with medication. Patient denies any chest pain, SOB, or headaches. Patient has no concerns today.     HPI  Discussed the use of AI scribe software for clinical note transcription with the patient, who gave verbal consent to proceed.  History of Present Illness Julie Zamora is a 74 year old female who presents for medication management and routine follow-up.  She has a history of hypothyroidism and has been managing her condition with medication. She has not been seen since June 2024 and called in December 2025 to inquire about her thyroid  medication renewal. She has been taking her medication five days a week, which has extended her supply beyond the typical duration. She receives a 90-day supply with a refill, but sometimes skips doses, leading to leftover medication.  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 She has lost ten pounds since 2024 and has been eating healthily. No chest pain, shortness of breath, falls, or memory problems.  Her last mammogram was in September 2025 at Dr. Roby office. She has declined vaccines for tetanus, flu, shingles, and pneumonia.  She is currently on Medicare Part A and is still working, which influences her healthcare coverage and scheduling of appointments.  Past Medical History:  Diagnosis Date   Hypothyroidism      Family History  Problem Relation Age of Onset   Cancer Mother    Diabetes Father    Cancer Cousin     Cancer Cousin     Current Medications[1]   Allergies[2]   Review of Systems  Constitutional: Negative.   Respiratory: Negative.    Cardiovascular: Negative.   Musculoskeletal: Negative.   Skin: Negative.   Psychiatric/Behavioral: Negative.       Today's Vitals   11/25/24 1053  BP: 130/74  Pulse: 85  Temp: 97.6 F (36.4 C)  TempSrc: Oral  Weight: 205 lb 6.4 oz (93.2 kg)  Height: 5' 9 (1.753 m)  PainSc: 0-No pain   Body mass index is 30.33 kg/m.  Wt Readings from Last 3 Encounters:  11/25/24 205 lb 6.4 oz (93.2 kg)  12/19/22 215 lb (97.5 kg)  06/18/22 216 lb 9.6 oz (98.2 kg)     Objective:  Physical Exam Vitals and nursing note reviewed.  Constitutional:      General: She is not in acute distress.    Appearance: Normal appearance. She is well-developed. She is obese.  HENT:     Head: Normocephalic and atraumatic.  Eyes:     Pupils: Pupils are equal, round, and reactive to light.  Cardiovascular:     Rate and Rhythm: Normal rate and regular rhythm.     Pulses: Normal pulses.     Heart sounds: Normal heart sounds. No murmur heard. Pulmonary:     Effort: Pulmonary effort is normal. No respiratory distress.     Breath sounds: Normal breath sounds. No wheezing.  Musculoskeletal:        General: Normal  range of motion.  Skin:    General: Skin is warm and dry.     Capillary Refill: Capillary refill takes less than 2 seconds.  Neurological:     General: No focal deficit present.     Mental Status: She is alert and oriented to person, place, and time.     Cranial Nerves: No cranial nerve deficit.     Motor: No weakness.  Psychiatric:        Mood and Affect: Mood normal.        Behavior: Behavior normal.        Thought Content: Thought content normal.        Judgment: Judgment normal.      Assessment And Plan:   Assessment & Plan Acquired hypothyroidism Managed with levothyroxine . Current regimen may not be sufficient. - Continue current  levothyroxine  regimen until lab results are reviewed. Mixed hyperlipidemia Will check lipid panel. Continue low fat diet  Abnormal glucose A1c had been stable, will check A1c today.  Herpes zoster vaccination declined  Influenza vaccination declined  Class 1 obesity due to excess calories with body mass index (BMI) of 30.0 to 30.9 in adult, unspecified whether serious comorbidity present Weight decreased by 10 pounds since 2024. - Continue current weight management efforts. Vitamin D  deficiency Will check vitamin D  level and supplement as needed.    Also encouraged to spend 15 minutes in the sun daily.    Routine wellness visit with no acute concerns. Weight decreased by 10 pounds since 2024. - Ordered blood work including cholesterol, hemoglobin A1c, thyroid , kidney, and liver function tests. - Scheduled follow-up appointment in six months for physical exam. Orders Placed This Encounter  Procedures   Hemoglobin A1c   Lipid panel   TSH + free T4   CMP14+EGFR   Vitamin D  (25 hydroxy)     Return in about 6 months (around 05/25/2025) for phy when able.  Patient was given opportunity to ask questions. Patient verbalized understanding of the plan and was able to repeat key elements of the plan. All questions were answered to their satisfaction.   LILLETTE Gaines Ada, FNP, have reviewed all documentation for this visit. The documentation on 11/25/24 for the exam, diagnosis, procedures, and orders are all accurate and complete.   IF YOU HAVE BEEN REFERRED TO A SPECIALIST, IT MAY TAKE 1-2 WEEKS TO SCHEDULE/PROCESS THE REFERRAL. IF YOU HAVE NOT HEARD FROM US /SPECIALIST IN TWO WEEKS, PLEASE GIVE US  A CALL AT [phone].      [1]  Current Outpatient Medications:    clotrimazole-betamethasone (LOTRISONE) cream, SMARTSIG:Topical Morning-Evening, Disp: , Rfl:    Fluocinolone Acetonide Scalp 0.01 % OIL, SMARTSIG:1 Sparingly Topical Daily PRN, Disp: , Rfl:    hydrocortisone 2.5 % cream,  hydrocortisone 2.5 % topical cream  APP EXT AA BID, Disp: , Rfl:    ketoconazole (NIZORAL) 2 % cream, Apply topically., Disp: , Rfl:    levothyroxine  (SYNTHROID ) 88 MCG tablet, TAKE 1 TABLET(88 MCG) BY MOUTH DAILY, Disp: 90 tablet, Rfl: 1   Vitamin D , Ergocalciferol , (DRISDOL ) 1.25 MG (50000 UNIT) CAPS capsule, TAKE 1 CAPSULE BY MOUTH 2 TIMES A WEEK Strength: 1.25mg , Disp: 24 capsule, Rfl: 0 [2] No Known Allergies

## 2024-11-25 ENCOUNTER — Ambulatory Visit: Payer: Self-pay | Admitting: Nurse Practitioner

## 2024-11-25 ENCOUNTER — Encounter: Payer: Self-pay | Admitting: Nurse Practitioner

## 2024-11-25 VITALS — BP 130/74 | HR 85 | Temp 97.6°F | Ht 69.0 in | Wt 205.4 lb

## 2024-11-25 DIAGNOSIS — Z6830 Body mass index (BMI) 30.0-30.9, adult: Secondary | ICD-10-CM

## 2024-11-25 DIAGNOSIS — R7309 Other abnormal glucose: Secondary | ICD-10-CM | POA: Diagnosis not present

## 2024-11-25 DIAGNOSIS — E782 Mixed hyperlipidemia: Secondary | ICD-10-CM

## 2024-11-25 DIAGNOSIS — E039 Hypothyroidism, unspecified: Secondary | ICD-10-CM

## 2024-11-25 DIAGNOSIS — E559 Vitamin D deficiency, unspecified: Secondary | ICD-10-CM

## 2024-11-25 DIAGNOSIS — Z2821 Immunization not carried out because of patient refusal: Secondary | ICD-10-CM | POA: Diagnosis not present

## 2024-11-25 DIAGNOSIS — E6609 Other obesity due to excess calories: Secondary | ICD-10-CM

## 2024-11-25 DIAGNOSIS — E66811 Obesity, class 1: Secondary | ICD-10-CM | POA: Diagnosis not present

## 2024-11-25 NOTE — Assessment & Plan Note (Addendum)
 Weight decreased by 10 pounds since 2024. - Continue current weight management efforts.

## 2024-11-25 NOTE — Assessment & Plan Note (Addendum)
 Managed with levothyroxine . Current regimen may not be sufficient. - Continue current levothyroxine  regimen until lab results are reviewed.

## 2024-11-26 ENCOUNTER — Ambulatory Visit: Payer: Self-pay | Admitting: Nurse Practitioner

## 2024-11-26 LAB — VITAMIN D 25 HYDROXY (VIT D DEFICIENCY, FRACTURES): Vit D, 25-Hydroxy: 60.8 ng/mL (ref 30.0–100.0)

## 2024-11-26 LAB — CMP14+EGFR
ALT: 8 [IU]/L (ref 0–32)
AST: 16 [IU]/L (ref 0–40)
Albumin: 4 g/dL (ref 3.8–4.8)
Alkaline Phosphatase: 82 [IU]/L (ref 49–135)
BUN/Creatinine Ratio: 12 (ref 12–28)
BUN: 11 mg/dL (ref 8–27)
Bilirubin Total: 0.4 mg/dL (ref 0.0–1.2)
CO2: 23 mmol/L (ref 20–29)
Calcium: 9 mg/dL (ref 8.7–10.3)
Chloride: 104 mmol/L (ref 96–106)
Creatinine, Ser: 0.94 mg/dL (ref 0.57–1.00)
Globulin, Total: 3.3 g/dL (ref 1.5–4.5)
Glucose: 74 mg/dL (ref 70–99)
Potassium: 3.8 mmol/L (ref 3.5–5.2)
Sodium: 142 mmol/L (ref 134–144)
Total Protein: 7.3 g/dL (ref 6.0–8.5)
eGFR: 65 mL/min/{1.73_m2}

## 2024-11-26 LAB — HEMOGLOBIN A1C
Est. average glucose Bld gHb Est-mCnc: 117 mg/dL
Hgb A1c MFr Bld: 5.7 % — ABNORMAL HIGH (ref 4.8–5.6)

## 2024-11-26 LAB — LIPID PANEL
Chol/HDL Ratio: 3.3 ratio (ref 0.0–4.4)
Cholesterol, Total: 217 mg/dL — ABNORMAL HIGH (ref 100–199)
HDL: 66 mg/dL
LDL Chol Calc (NIH): 138 mg/dL — ABNORMAL HIGH (ref 0–99)
Triglycerides: 75 mg/dL (ref 0–149)
VLDL Cholesterol Cal: 13 mg/dL (ref 5–40)

## 2024-11-26 LAB — TSH+FREE T4
Free T4: 1.08 ng/dL (ref 0.82–1.77)
TSH: 4.46 u[IU]/mL (ref 0.450–4.500)

## 2024-11-26 NOTE — Assessment & Plan Note (Addendum)
 A1c had been stable, will check A1c today.

## 2024-11-26 NOTE — Assessment & Plan Note (Addendum)
 Will check lipid panel. Continue low fat diet

## 2024-11-26 NOTE — Assessment & Plan Note (Addendum)
 Will check vitamin D  level and supplement as needed.    Also encouraged to spend 15 minutes in the sun daily.

## 2024-12-02 ENCOUNTER — Other Ambulatory Visit: Payer: Self-pay | Admitting: Nurse Practitioner

## 2024-12-02 DIAGNOSIS — E782 Mixed hyperlipidemia: Secondary | ICD-10-CM

## 2024-12-02 NOTE — Progress Notes (Signed)
 Order has been sent

## 2025-05-31 ENCOUNTER — Encounter: Payer: Self-pay | Admitting: Nurse Practitioner
